# Patient Record
Sex: Male | Born: 1985 | Race: White | Hispanic: No | Marital: Married | Smoking: Never smoker
Health system: Southern US, Community
[De-identification: ages and names within clinical notes are randomized; demographics above are authoritative.]

## PROBLEM LIST (undated history)

## (undated) DIAGNOSIS — E782 Mixed hyperlipidemia: Secondary | ICD-10-CM

## (undated) DIAGNOSIS — K589 Irritable bowel syndrome without diarrhea: Secondary | ICD-10-CM

## (undated) DIAGNOSIS — K760 Fatty (change of) liver, not elsewhere classified: Secondary | ICD-10-CM

## (undated) DIAGNOSIS — E538 Deficiency of other specified B group vitamins: Secondary | ICD-10-CM

## (undated) DIAGNOSIS — G4733 Obstructive sleep apnea (adult) (pediatric): Secondary | ICD-10-CM

## (undated) DIAGNOSIS — R42 Dizziness and giddiness: Secondary | ICD-10-CM

## (undated) DIAGNOSIS — I10 Essential (primary) hypertension: Secondary | ICD-10-CM

## (undated) DIAGNOSIS — R569 Unspecified convulsions: Secondary | ICD-10-CM

## (undated) HISTORY — DX: Deficiency of other specified B group vitamins: E53.8

## (undated) HISTORY — DX: Essential (primary) hypertension: I10

## (undated) HISTORY — DX: Unspecified convulsions: R56.9

## (undated) HISTORY — PX: VASECTOMY: SHX75

## (undated) HISTORY — DX: Irritable bowel syndrome, unspecified: K58.9

## (undated) HISTORY — DX: Mixed hyperlipidemia: E78.2

## (undated) HISTORY — DX: Fatty (change of) liver, not elsewhere classified: K76.0

## (undated) HISTORY — PX: TYMPANOPLASTY: SHX33

## (undated) HISTORY — DX: Dizziness and giddiness: R42

## (undated) HISTORY — DX: Obstructive sleep apnea (adult) (pediatric): G47.33

---
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
## 2007-01-19 ENCOUNTER — Emergency Department (HOSPITAL_COMMUNITY): Admission: EM | Admit: 2007-01-19 | Discharge: 2007-01-19 | Payer: Self-pay | Admitting: Family Medicine

## 2007-03-01 ENCOUNTER — Emergency Department (HOSPITAL_COMMUNITY): Admission: EM | Admit: 2007-03-01 | Discharge: 2007-03-01 | Payer: Self-pay | Admitting: Emergency Medicine

## 2016-12-16 ENCOUNTER — Ambulatory Visit (HOSPITAL_COMMUNITY)
Admission: EM | Admit: 2016-12-16 | Discharge: 2016-12-16 | Disposition: A | Discharge status: Home / Self Care | Payer: BLUE CROSS/BLUE SHIELD | Attending: Family Medicine | Admitting: Family Medicine

## 2016-12-16 ENCOUNTER — Encounter (HOSPITAL_COMMUNITY): Payer: Self-pay | Admitting: Emergency Medicine

## 2016-12-16 DIAGNOSIS — R062 Wheezing: Secondary | ICD-10-CM

## 2016-12-16 DIAGNOSIS — R05 Cough: Secondary | ICD-10-CM | POA: Diagnosis not present

## 2016-12-16 DIAGNOSIS — J40 Bronchitis, not specified as acute or chronic: Secondary | ICD-10-CM | POA: Diagnosis not present

## 2016-12-16 DIAGNOSIS — R059 Cough, unspecified: Secondary | ICD-10-CM

## 2016-12-16 DIAGNOSIS — J683 Other acute and subacute respiratory conditions due to chemicals, gases, fumes and vapors: Secondary | ICD-10-CM

## 2016-12-16 MED ORDER — ALBUTEROL SULFATE HFA 108 (90 BASE) MCG/ACT IN AERS: 1.0000 | INHALATION_SPRAY | Freq: Four times a day (QID) | RESPIRATORY_TRACT | 0 refills | Status: DC | PRN

## 2016-12-16 MED ORDER — AZITHROMYCIN 250 MG PO TABS
250.0000 mg | ORAL_TABLET | Freq: Every day | ORAL | 0 refills | Status: DC
Start: 2016-12-16 — End: 2017-03-26

## 2016-12-16 MED ORDER — ALBUTEROL SULFATE (2.5 MG/3ML) 0.083% IN NEBU
2.5000 mg | INHALATION_SOLUTION | Freq: Once | RESPIRATORY_TRACT | Status: AC
  Administered 2016-12-16: 2.5 mg via RESPIRATORY_TRACT

## 2016-12-16 MED ORDER — ALBUTEROL SULFATE (2.5 MG/3ML) 0.083% IN NEBU
INHALATION_SOLUTION | RESPIRATORY_TRACT | Status: AC
Start: 2016-12-16 — End: 2016-12-16
  Filled 2016-12-16: qty 3

## 2016-12-16 MED ORDER — METHYLPREDNISOLONE 4 MG PO TBPK: ORAL_TABLET | ORAL | 0 refills | Status: DC

## 2016-12-16 NOTE — ED Provider Notes (Signed)
CSN: [id number]     Arrival date & time 12/16/16  1226 History   First MD Initiated Contact with Patient 12/16/16 1243     Chief Complaint  Patient presents with  . Cough   (Consider location/radiation/quality/duration/timing/severity/associated sxs/prior Treatment) Patient presents with c/o cough and burning in chest.  He has had sx's for a week and have worsened in last 3 days.  He c/o wheezing which is worse at night.  He states he just quit vaping.   The history is provided by the patient.  Cough  Cough characteristics:  Productive Sputum characteristics:  White Severity:  Moderate Onset quality:  Sudden Duration:  1 week Timing:  Constant Progression:  Worsening Chronicity:  New Smoker: no   Relieved by:  Nothing Worsened by:  Nothing Ineffective treatments:  None tried Associated symptoms: wheezing     History reviewed. No pertinent past medical history. History reviewed. No pertinent surgical history. History reviewed. No pertinent family history. Social History  Substance Use Topics  . Smoking status: Never Smoker  . Smokeless tobacco: Never Used  . Alcohol use No    Review of Systems  Constitutional: Positive for fatigue.  HENT: Positive for congestion.   Eyes: Negative.   Respiratory: Positive for cough and wheezing.   Cardiovascular: Negative.   Gastrointestinal: Negative.   Endocrine: Negative.   Genitourinary: Negative.   Musculoskeletal: Negative.   Allergic/Immunologic: Negative.   Neurological: Negative.   Hematological: Negative.   Psychiatric/Behavioral: Negative.     Allergies  Patient has no known allergies.  Home Medications   Prior to Admission medications   Medication Sig Start Date End Date Taking? Authorizing Provider  albuterol (PROVENTIL HFA;VENTOLIN HFA) 108 (90 Base) MCG/ACT inhaler Inhale 1-2 puffs into the lungs every 6 (six) hours as needed for wheezing or shortness of breath. 12/16/16   Deatra Canterxford, William J, FNP  azithromycin  (ZITHROMAX) 250 MG tablet Take 1 tablet (250 mg total) by mouth daily. Take first 2 tablets together, then 1 every day until finished. 12/16/16   Deatra Canterxford, William J, FNP  methylPREDNISolone (MEDROL DOSEPAK) 4 MG TBPK tablet Take 6-5-4-3-2-1 po qd 12/16/16   Deatra Canterxford, William J, FNP   Meds Ordered and Administered this Visit   Medications  albuterol (PROVENTIL) (2.5 MG/3ML) 0.083% nebulizer solution 2.5 mg (2.5 mg Nebulization Given 12/16/16 1252)    BP 130/73 (BP Location: Right Arm)   Pulse 65   Temp 98.2 F (36.8 C) (Oral)   Resp 18   SpO2 98%  No data found.   Physical Exam  Constitutional: He is oriented to person, place, and time. He appears well-developed and well-nourished.  HENT:  Head: Normocephalic and atraumatic.  Right Ear: External ear normal.  Left Ear: External ear normal.  Mouth/Throat: Oropharynx is clear and moist.  Eyes: Conjunctivae and EOM are normal. Pupils are equal, round, and reactive to light.  Neck: Normal range of motion. Neck supple.  Cardiovascular: Normal rate, regular rhythm and normal heart sounds.   Pulmonary/Chest: Effort normal. He has wheezes.  Abdominal: Soft. Bowel sounds are normal.  Neurological: He is alert and oriented to person, place, and time.  Nursing note and vitals reviewed.   Urgent Care Course     Procedures (including critical care time)  Labs Review Labs Reviewed - No data to display  Imaging Review No results found.   Visual Acuity Review  Right Eye Distance:   Left Eye Distance:   Bilateral Distance:    Right Eye Near:   Left  Eye Near:    Bilateral Near:         MDM   1. Bronchitis   2. Cough   3. Reactive airways dysfunction syndrome (HCC)    Z-Pak Medrol dose pack as directed 4mg  #21 Albuterol MDI  Albuterol Neb treatment      Deatra Canter, FNP 12/16/16 1302    Deatra Canter, FNP 12/16/16 1402

## 2016-12-16 NOTE — ED Triage Notes (Signed)
[facility] a [facility] of a cough and "buring in his chest" x 3 days. The patient reported that he has been using OTC medications with minimal relief.

## 2017-03-26 ENCOUNTER — Ambulatory Visit (HOSPITAL_COMMUNITY)
Admission: EM | Admit: 2017-03-26 | Discharge: 2017-03-26 | Disposition: A | Discharge status: Home / Self Care | Payer: BLUE CROSS/BLUE SHIELD | Attending: Physician Assistant | Admitting: Physician Assistant

## 2017-03-26 ENCOUNTER — Encounter (HOSPITAL_COMMUNITY): Payer: Self-pay | Admitting: *Deleted

## 2017-03-26 DIAGNOSIS — J00 Acute nasopharyngitis [common cold]: Secondary | ICD-10-CM | POA: Diagnosis not present

## 2017-03-26 DIAGNOSIS — R0981 Nasal congestion: Secondary | ICD-10-CM | POA: Diagnosis present

## 2017-03-26 LAB — POCT RAPID STREP A: Streptococcus, Group A Screen (Direct): NEGATIVE

## 2017-03-26 MED ORDER — CETIRIZINE-PSEUDOEPHEDRINE ER 5-120 MG PO TB12: 1.0000 | ORAL_TABLET | Freq: Every day | ORAL | 0 refills | Status: DC

## 2017-03-26 MED ORDER — PHENOL 1.4 % MT LIQD: 1.0000 | OROMUCOSAL | 0 refills | Status: DC | PRN

## 2017-03-26 MED ORDER — BENZONATATE 100 MG PO CAPS: 100.0000 mg | ORAL_CAPSULE | Freq: Three times a day (TID) | ORAL | 0 refills | Status: DC

## 2017-03-26 MED ORDER — FLUTICASONE PROPIONATE 50 MCG/ACT NA SUSP: 2.0000 | Freq: Every day | NASAL | 0 refills | Status: DC

## 2017-03-26 NOTE — Discharge Instructions (Signed)
Rapid strep negative. Symptoms are most likely due to viral illness. Tessalon for cough. Phenol for sore throat. Flonase and/or Zyrtec-D for nasal congestion. You can use over the counter nasal saline rinse such as neti pot for nasal congestion. Monitor for any worsening of symptoms, swelling of the throat, trouble breathing, trouble swallowing, follow up for reevaluation.   For sore throat try using a honey-based tea. Use 3 teaspoons of honey with juice squeezed from half lemon. Place shaved pieces of ginger into 1/2-1 cup of water and warm over stove top. Then mix the ingredients and repeat every 4 hours as needed.

## 2017-03-26 NOTE — ED Triage Notes (Signed)
Patient reports 4 day history of nasal congestion, hoarse voice, and productive cough. Patient reports no relief with otc. Reports fevers at night with sob.

## 2017-03-26 NOTE — ED Provider Notes (Signed)
[facility]    CSN: [id number] Arrival date & time: 03/26/17  1810     History   Chief Complaint Chief Complaint  Patient presents with  . Nasal Congestion    HPI Jesus Montes is a 31 y.o. male.   31 year old male comes in for 4 day history of nasal congestion, hoarse voice, sore throat and productive cough. States intermittent hoarseness. Sore throat can radiate to the ears. Productive cough, most severe at night. Tmax 99.6. Has taken otc nyquil/dayquil with some relief. But he continues to wake up at night with sore throat. Has had shortness of breath due to pain from the throat and hoarseness. Has had mild diarrhea, denies abdominal pain, nausea, vomiting. Denies chest pain. Has had some wheezing. History of seasonal allergies, asthma. Never smoker. Positive sick contact.       History reviewed. No pertinent past medical history.  There are no active problems to display for this patient.   History reviewed. No pertinent surgical history.     Home Medications    Prior to Admission medications   Medication Sig Start Date End Date Taking? Authorizing Provider  benzonatate (TESSALON) 100 MG capsule Take 1 capsule (100 mg total) by mouth every 8 (eight) hours. 03/26/17   Cathie Hoops, Johnnette Laux V, PA-C  cetirizine-pseudoephedrine (ZYRTEC-D) 5-120 MG tablet Take 1 tablet by mouth daily. 03/26/17   Cathie Hoops, Edison Wollschlager V, PA-C  fluticasone (FLONASE) 50 MCG/ACT nasal spray Place 2 sprays into both nostrils daily. 03/26/17   Cathie Hoops, Kento Gossman V, PA-C  phenol (CHLORASEPTIC) 1.4 % LIQD Use as directed 1 spray in the mouth or throat as needed for throat irritation / pain. 03/26/17   Belinda Fisher, PA-C    Family History History reviewed. No pertinent family history.  Social History Social History  Substance Use Topics  . Smoking status: Never Smoker  . Smokeless tobacco: Never Used  . Alcohol use No     Allergies   Patient has no known allergies.   Review of Systems Review of Systems  Reason  unable to perform ROS: See HPI as above.     Physical Exam Triage Vital Signs ED Triage Vitals  Enc Vitals Group     BP 03/26/17 1845 128/79     Pulse Rate 03/26/17 1845 89     Resp 03/26/17 1845 17     Temp 03/26/17 1845 98.2 F (36.8 C)     Temp Source 03/26/17 1845 Oral     SpO2 03/26/17 1845 100 %     Weight --      Height --      Head Circumference --      Peak Flow --      Pain Score 03/26/17 1846 3     Pain Loc --      Pain Edu? --      Excl. in GC? --    No data found.   Updated Vital Signs BP 128/79 (BP Location: Left Arm)   Pulse 89   Temp 98.2 F (36.8 C) (Oral)   Resp 17   SpO2 100%   Physical Exam  Constitutional: He is oriented to person, place, and time. He appears well-developed and well-nourished. No distress.  HENT:  Head: Normocephalic and atraumatic.  Right Ear: External ear and ear canal normal.  Left Ear: External ear and ear canal normal. A middle ear effusion is present.  Nose: Mucosal edema and rhinorrhea present. Right sinus exhibits maxillary sinus tenderness. Right sinus exhibits  no frontal sinus tenderness. Left sinus exhibits maxillary sinus tenderness. Left sinus exhibits no frontal sinus tenderness.  Mouth/Throat: Uvula is midline and mucous membranes are normal. Posterior oropharyngeal erythema present. Tonsils are 1+ on the right. Tonsils are 1+ on the left. No tonsillar exudate.  Right TM is white and opaque with red injection at the 10 o'clock region. 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   Eyes: Pupils are equal, round, and reactive to light. Conjunctivae are normal.  Neck: Normal range of motion. Neck supple.  Cardiovascular: Normal rate, regular rhythm and normal heart sounds.  Exam reveals no gallop and no friction rub.   No murmur heard. Pulmonary/Chest: Effort normal and breath sounds normal. He has no decreased breath sounds. He has no wheezes. He has no rhonchi. He has no rales.    Lymphadenopathy:    He has no cervical adenopathy.  Neurological: He is alert and oriented to person, place, and time.  Skin: Skin is warm and dry.  Psychiatric: He has a normal mood and affect. His behavior is normal. Judgment normal.     UC Treatments / Results  Labs (all labs ordered are listed, but only abnormal results are displayed) Labs Reviewed  CULTURE, GROUP A STREP [facility])  POCT RAPID STREP A    EKG  EKG Interpretation None       Radiology No results found.  Procedures Procedures (including critical care time)  Medications Ordered in UC Medications - No data to display   Initial Impression / Assessment and Plan / UC Course  I have reviewed the triage vital signs and the nursing notes.  Pertinent labs & imaging results that were available during my care of the patient were reviewed by me and considered in my medical decision making (see chart for details).    Rapid strep negative. Symptomatic treatment as needed. Return precautions given.     Final Clinical Impressions(s) / UC Diagnoses   Final diagnoses:  Acute nasopharyngitis    New Prescriptions Discharge Medication List as of 03/26/2017  7:52 PM    START taking these medications   Details  benzonatate (TESSALON) 100 MG capsule Take 1 capsule (100 mg total) by mouth every 8 (eight) hours., Starting Fri 03/26/2017, Normal    cetirizine-pseudoephedrine (ZYRTEC-D) 5-120 MG tablet Take 1 tablet by mouth daily., Starting Fri 03/26/2017, Normal    fluticasone (FLONASE) 50 MCG/ACT nasal spray Place 2 sprays into both nostrils daily., Starting Fri 03/26/2017, Normal    phenol (CHLORASEPTIC) 1.4 % LIQD Use as directed 1 spray in the mouth or throat as needed for throat irritation / pain., Starting Fri 03/26/2017, Normal          Cathie Hoops, Darlean Warmoth V, PA-C 03/26/17 2125

## 2017-03-29 LAB — CULTURE, GROUP A STREP (THRC)

## 2020-05-21 ENCOUNTER — Other Ambulatory Visit: Payer: Self-pay

## 2020-05-21 ENCOUNTER — Ambulatory Visit (INDEPENDENT_AMBULATORY_CARE_PROVIDER_SITE_OTHER): Payer: BC Managed Care – PPO | Admitting: Family Medicine

## 2020-05-21 ENCOUNTER — Encounter: Payer: Self-pay | Admitting: Family Medicine

## 2020-05-21 VITALS — BP 130/90 | HR 72 | Temp 98.5°F | Ht 67.0 in | Wt 246.5 lb

## 2020-05-21 DIAGNOSIS — Z13220 Encounter for screening for lipoid disorders: Secondary | ICD-10-CM | POA: Diagnosis not present

## 2020-05-21 DIAGNOSIS — E669 Obesity, unspecified: Secondary | ICD-10-CM

## 2020-05-21 DIAGNOSIS — Z0000 Encounter for general adult medical examination without abnormal findings: Secondary | ICD-10-CM | POA: Diagnosis not present

## 2020-05-21 DIAGNOSIS — B359 Dermatophytosis, unspecified: Secondary | ICD-10-CM | POA: Diagnosis not present

## 2020-05-21 DIAGNOSIS — R030 Elevated blood-pressure reading, without diagnosis of hypertension: Secondary | ICD-10-CM | POA: Insufficient documentation

## 2020-05-21 DIAGNOSIS — Z8249 Family history of ischemic heart disease and other diseases of the circulatory system: Secondary | ICD-10-CM | POA: Insufficient documentation

## 2020-05-21 NOTE — Assessment & Plan Note (Signed)
Blood work today. Lifestyle changes.

## 2020-05-21 NOTE — Assessment & Plan Note (Signed)
Recommended OTC lamisil cream

## 2020-05-21 NOTE — Assessment & Plan Note (Signed)
Several family members with MI in the 30s including his father. Will get labs today. Discussed that he could consider cardiology consult or Coronary CT.

## 2020-05-21 NOTE — Patient Instructions (Addendum)
For rash - try over the counter lamisil cream (anti-fungal cream)    You have a Hemorrhoid  Here are ways to prevent future issues and to treat your hemorrhoid 1) Make sure you get 25-35 g of INSOLUBLE fiber a day -- ideally from diet but you can also take a fiber supplement like Metamucil 2) Drink 1.5 to 2 liters of water daily  3) Avoid straining and prolonged periods on the toilet 4) Limit fatty foods and alcohol 5) Try to lose weight  How to care for your hemorrhoid now 1) Sitz Baths and warm water sprays 2) Wipe with a wet wipe or flushable wipe  3) Consider using stool softeners (Like Colace or Miralax) 4) Topical Hemorrhoid cream (Preparation H is available over the counter)  5) Steroid suppository - though can be expensive  Let me know if no improvement in 6 weeks -- you may need surgery  Your blood pressure high.   High blood pressure increases your risk for heart attack and stroke.    Please check your blood pressure 2-4 times a week.   To check your blood pressure 1) Sit in a quiet and relaxed place for 5 minutes 2) Make sure your feet are flat on the ground 3) Consider checking first thing in the morning   Normal blood pressure is less than 140/90 Ideally you blood pressure should be around 120/80  Other ways you can reduce your blood pressure:  1) Regular exercise -- Try to get 150 minutes (30 minutes, 5 days a week) of moderate to vigorous aerobic excercise -- Examples: brisk walking (2.5 miles per hour), water aerobics, dancing, gardening, tennis, biking slower than 10 miles per hour 2) DASH Diet - low fat meats, more fresh fruits and vegetables, whole grains, low salt 3) Quit smoking if you smoke 4) Loose 5-10% of your body weight   DASH Eating Plan DASH stands for "Dietary Approaches to Stop Hypertension." The DASH eating plan is a healthy eating plan that has been shown to reduce high blood pressure (hypertension). It may also reduce your risk for type  2 diabetes, heart disease, and stroke. The DASH eating plan may also help with weight loss. What are tips for following this plan?  General guidelines  Avoid eating more than 2,300 mg (milligrams) of salt (sodium) a day. If you have hypertension, you may need to reduce your sodium intake to 1,500 mg a day.  Limit alcohol intake to no more than 1 drink a day for nonpregnant women and 2 drinks a day for men. One drink equals 12 oz of beer, 5 oz of wine, or 1 oz of hard liquor.  Work with your health care provider to maintain a healthy body weight or to lose weight. Ask what an ideal weight is for you.  Get at least 30 minutes of exercise that causes your heart to beat faster (aerobic exercise) most days of the week. Activities may include walking, swimming, or biking.  Work with your health care provider or diet and nutrition specialist (dietitian) to adjust your eating plan to your individual calorie needs. Reading food labels   Check food labels for the amount of sodium per serving. Choose foods with less than 5 percent of the Daily Value of sodium. Generally, foods with less than 300 mg of sodium per serving fit into this eating plan.  To find whole grains, look for the word "whole" as the first word in the ingredient list. Shopping  Buy products labeled as "  low-sodium" or "no salt added."  Buy fresh foods. Avoid canned foods and premade or frozen meals. Cooking  Avoid adding salt when cooking. Use salt-free seasonings or herbs instead of table salt or sea salt. Check with your health care provider or pharmacist before using salt substitutes.  Do not fry foods. Cook foods using healthy methods such as baking, boiling, grilling, and broiling instead.  Cook with heart-healthy oils, such as olive, canola, soybean, or sunflower oil. Meal planning  Eat a balanced diet that includes: ? 5 or more servings of fruits and vegetables each day. At each meal, try to fill half of your plate  with fruits and vegetables. ? Up to 6-8 servings of whole grains each day. ? Less than 6 oz of lean meat, poultry, or fish each day. A 3-oz serving of meat is about the same size as a deck of cards. One egg equals 1 oz. ? 2 servings of low-fat dairy each day. ? A serving of nuts, seeds, or beans 5 times each week. ? Heart-healthy fats. Healthy fats called Omega-3 fatty acids are found in foods such as flaxseeds and coldwater fish, like sardines, salmon, and mackerel.  Limit how much you eat of the following: ? Canned or prepackaged foods. ? Food that is high in trans fat, such as fried foods. ? Food that is high in saturated fat, such as fatty meat. ? Sweets, desserts, sugary drinks, and other foods with added sugar. ? Full-fat dairy products.  Do not salt foods before eating.  Try to eat at least 2 vegetarian meals each week.  Eat more home-cooked food and less restaurant, buffet, and fast food.  When eating at a restaurant, ask that your food be prepared with less salt or no salt, if possible. What foods are recommended? The items listed may not be a complete list. Talk with your dietitian about what dietary choices are best for you. Grains Whole-grain or whole-wheat bread. Whole-grain or whole-wheat pasta. Brown rice. Orpah Cobb. Bulgur. Whole-grain and low-sodium cereals. Pita bread. Low-fat, low-sodium crackers. Whole-wheat flour tortillas. Vegetables Fresh or frozen vegetables (raw, steamed, roasted, or grilled). Low-sodium or reduced-sodium tomato and vegetable juice. Low-sodium or reduced-sodium tomato sauce and tomato paste. Low-sodium or reduced-sodium canned vegetables. Fruits All fresh, dried, or frozen fruit. Canned fruit in natural juice (without added sugar). Meat and other protein foods Skinless chicken or [place]. Ground chicken or [place]. Pork with fat trimmed off. Fish and seafood. Egg whites. Dried beans, peas, or lentils. Unsalted nuts, nut butters, and seeds.  Unsalted canned beans. Lean cuts of beef with fat trimmed off. Low-sodium, lean deli meat. Dairy Low-fat (1%) or fat-free (skim) milk. Fat-free, low-fat, or reduced-fat cheeses. Nonfat, low-sodium ricotta or cottage cheese. Low-fat or nonfat yogurt. Low-fat, low-sodium cheese. Fats and oils Soft margarine without trans fats. Vegetable oil. Low-fat, reduced-fat, or light mayonnaise and salad dressings (reduced-sodium). Canola, safflower, olive, soybean, and sunflower oils. Avocado. Seasoning and other foods Herbs. Spices. Seasoning mixes without salt. Unsalted popcorn and pretzels. Fat-free sweets. What foods are not recommended? The items listed may not be a complete list. Talk with your dietitian about what dietary choices are best for you. Grains Baked goods made with fat, such as croissants, muffins, or some breads. Dry pasta or rice meal packs. Vegetables Creamed or fried vegetables. Vegetables in a cheese sauce. Regular canned vegetables (not low-sodium or reduced-sodium). Regular canned tomato sauce and paste (not low-sodium or reduced-sodium). Regular tomato and vegetable juice (not low-sodium or reduced-sodium). Rosita Fire.  Olives. Fruits Canned fruit in a light or heavy syrup. Fried fruit. Fruit in cream or butter sauce. Meat and other protein foods Fatty cuts of meat. Ribs. Fried meat. Tomasa Blase. Sausage. Bologna and other processed lunch meats. Salami. Fatback. Hotdogs. Bratwurst. Salted nuts and seeds. Canned beans with added salt. Canned or smoked fish. Whole eggs or egg yolks. Chicken or [place] with skin. Dairy Whole or 2% milk, cream, and half-and-half. Whole or full-fat cream cheese. Whole-fat or sweetened yogurt. Full-fat cheese. Nondairy creamers. Whipped toppings. Processed cheese and cheese spreads. Fats and oils Butter. Stick margarine. Lard. Shortening. Ghee. Bacon fat. Tropical oils, such as coconut, palm kernel, or palm oil. Seasoning and other foods Salted popcorn and  pretzels. Onion salt, garlic salt, seasoned salt, table salt, and sea salt. Worcestershire sauce. Tartar sauce. Barbecue sauce. Teriyaki sauce. Soy sauce, including reduced-sodium. Steak sauce. Canned and packaged gravies. Fish sauce. Oyster sauce. Cocktail sauce. Horseradish that you find on the shelf. Ketchup. Mustard. Meat flavorings and tenderizers. Bouillon cubes. Hot sauce and Tabasco sauce. Premade or packaged marinades. Premade or packaged taco seasonings. Relishes. Regular salad dressings. Where to find more information:  National Heart, Lung, and Blood Institute: PopSteam.is  American Heart Association: www.heart.org Summary  The DASH eating plan is a healthy eating plan that has been shown to reduce high blood pressure (hypertension). It may also reduce your risk for type 2 diabetes, heart disease, and stroke.  With the DASH eating plan, you should limit salt (sodium) intake to 2,300 mg a day. If you have hypertension, you may need to reduce your sodium intake to 1,500 mg a day.  When on the DASH eating plan, aim to eat more fresh fruits and vegetables, whole grains, lean proteins, low-fat dairy, and heart-healthy fats.  Work with your health care provider or diet and nutrition specialist (dietitian) to adjust your eating plan to your individual calorie needs. This information is not intended to replace advice given to you by your health care provider. Make sure you discuss any questions you have with your health care provider. Document Revised: 05/21/2017 Document Reviewed: 06/01/2016 Elsevier Patient Education  2020 [employer].

## 2020-05-21 NOTE — Progress Notes (Signed)
Annual Exam   Chief Complaint:  Chief Complaint  Patient presents with  . Establish Care    no concerns    History of Present Illness:  SAVON BORDONARO is a 34 y.o. presents today for annual examination.     Nutrition/Lifestyle Exercise: during hunting season - every other day is hunting - 4 miles + walking Diet: not currently, not great - tries eat fruits/veggies but is a big meat eater He is single partner, contraception - vasectomy.  Any issues getting or maintaining an erection? no  Social History   Tobacco Use  Smoking Status Never Smoker  Smokeless Tobacco Never Used   Social History   Substance and Sexual Activity  Alcohol Use Yes   Comment: once every few months   Social History   Substance and Sexual Activity  Drug Use No     Safety The patient wears seatbelts: yes.     The patient feels safe at home and in their relationships: yes.  General Health Dentist in the last year: Yes Eye doctor: yes  Weight Wt Readings from Last 3 Encounters:  05/21/20 246 lb 8 oz (111.8 kg)   Patient has high BMI  BMI Readings from Last 1 Encounters:  05/21/20 38.61 kg/m     Chronic disease screening Blood pressure monitoring:  BP Readings from Last 3 Encounters:  05/21/20 130/90  03/26/17 128/79  12/16/16 130/73    Lipid Monitoring: Indication for screening: age >35, obesity, diabetes, family hx, CV risk factors.  Lipid screening: Yes  No results found for: CHOL, HDL, LDLCALC, LDLDIRECT, TRIG, CHOLHDL   Diabetes Screening: age >85, overweight, family hx, PCOS, hx of gestational diabetes, at risk ethnicity, elevated blood pressure >135/80.  Diabetes Screening screening: Yes  No results found for: HGBA1C    There is no immunization history on file for this patient.  Past Medical History:  Diagnosis Date  . Seizures (HCC)    in elementary school due to blood sugar    Past Surgical History:  Procedure Laterality Date  . TYMPANOPLASTY    .  VASECTOMY      Prior to Admission medications   Not on File    No Known Allergies   Social History   Socioeconomic History  . Marital status: Married    Spouse name: Chelsey  . Number of children: 4  . Years of education: high school  . Highest education level: Not on file  Occupational History  . Not on file  Tobacco Use  . Smoking status: Never Smoker  . Smokeless tobacco: Never Used  Vaping Use  . Vaping Use: Every day  Substance and Sexual Activity  . Alcohol use: Yes    Comment: once every few months  . Drug use: No  . Sexual activity: Yes    Birth control/protection: Surgical  Other Topics Concern  . 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 Difficulty of Paying Living Expenses: Not on file  Food Insecurity:   . Worried About Programme researcher, broadcasting/film/video in the Last Year: Not on file  . Ran Out of Food in the Last Year: Not on file  Transportation Needs:   . Lack of Transportation (Medical): Not on file  . Lack of Transportation (Non-Medical): Not on file  Physical Activity:   . Days of Exercise per Week: Not on file  . Minutes of Exercise per Session: Not on file  Stress:   . Feeling of Stress : Not on file  Social Connections:   . Frequency of Communication with Friends and Family: Not on file  . Frequency of Social Gatherings with Friends and Family: Not on file  . Attends Religious Services: Not on file   . Active Member of Clubs or Organizations: Not on file  . Attends Banker Meetings: Not on file  . Marital Status: Not on file  Intimate Partner Violence:   . Fear of Current or Ex-Partner: Not on file  . Emotionally Abused: Not on file  . Physically Abused: Not on file  . Sexually Abused: Not on file    Family History  Problem Relation Age of Onset  . Asthma Mother   . Heart attack Father 86  . Asthma Son   . Heart disease Paternal Uncle   . Heart attack Paternal Uncle 35  . Heart disease Maternal Grandmother        stent placement  . Parkinson's disease Maternal Grandfather   . Heart attack Paternal Grandfather 31  . Heart attack Paternal Uncle 35  . Heart attack Paternal Uncle   . Heart attack Paternal Uncle   . Heart attack Paternal Uncle     Review of Systems  Constitutional: Negative for chills and fever.  HENT: Negative for congestion and sore throat.   Eyes: Negative for blurred vision and double vision.  Respiratory: Negative for shortness of breath.   Cardiovascular: Negative for chest pain.  Gastrointestinal: Negative for heartburn, nausea and vomiting.       Hemorrhoids   Genitourinary: Negative.   Musculoskeletal: Negative.  Negative for myalgias.  Skin: Negative for rash.  Neurological: Negative for dizziness and headaches.  Endo/Heme/Allergies: Does not bruise/bleed easily.  Psychiatric/Behavioral: Negative for depression. The patient is not nervous/anxious.      Physical Exam BP 130/90   Pulse 72   Temp 98.5 F (36.9 C) (Temporal)   Ht 5\' 7"  (1.702 m)   Wt 246 lb 8 oz (111.8 kg)   SpO2 97%   BMI 38.61 kg/m    BP Readings from Last 3 Encounters:  05/21/20 130/90  03/26/17 128/79  12/16/16 130/73      Physical Exam Constitutional:      General: He is not in acute distress.    Appearance: He is well-developed. He is not diaphoretic.  HENT:     Head: Normocephalic and atraumatic.     Right Ear: Tympanic membrane and ear  canal normal.     Left Ear: Tympanic membrane and ear canal normal.     Nose: Nose normal.     Mouth/Throat:     Pharynx: Uvula midline.  Eyes:     General: No scleral icterus.    Conjunctiva/sclera: Conjunctivae normal.     Pupils: Pupils are equal, round, and reactive  to light.  Cardiovascular:     Rate and Rhythm: Normal rate and regular rhythm.     Heart sounds: Normal heart sounds. No murmur heard.   Pulmonary:     Effort: Pulmonary effort is normal. No respiratory distress.     Breath sounds: Normal breath sounds. No wheezing.  Abdominal:     General: Bowel sounds are normal. There is no distension.     Palpations: Abdomen is soft. There is no mass.     Tenderness: There is no abdominal tenderness. There is no guarding.  Musculoskeletal:        General: Normal range of motion.     Cervical back: Normal range of motion and neck supple.  Lymphadenopathy:     Cervical: No cervical adenopathy.  Skin:    General: Skin is warm and dry.     Capillary Refill: Capillary refill takes less than 2 seconds.     Comments: Left anterior neck below the chin with circular erythematous macular patch with slight scale  Neurological:     Mental Status: He is alert and oriented to person, place, and time.        Results:  PHQ-9:   Depression screen PHQ 2/9 05/21/2020  Decreased Interest 0  Down, Depressed, Hopeless 0  PHQ - 2 Score 0      Assessment: 34 y.o. here for routine annual physical examination.  Plan: Problem List Items Addressed This Visit      Musculoskeletal and Integument   Tinea    Recommended OTC lamisil cream        Other   Elevated blood pressure reading    Blood work today. Lifestyle changes.       Relevant Orders   Comprehensive metabolic panel   TSH   CBC with Differential   Lipid panel   Family history of MI (myocardial infarction)    Several family members with MI in the 30s including his father. Will get labs today. Discussed that he could  consider cardiology consult or Coronary CT.        Other Visit Diagnoses    Annual physical exam    -  Primary   Screening for hyperlipidemia       Relevant Orders   Lipid panel      Screening: -- Blood pressure screen elevated: continued to monitor. -- cholesterol screening: will obtain -- Weight screening: obese: discussed management options, including lifestyle, dietary, and exercise. -- Diabetes Screening: will obtain -- Nutrition: Encouraged healthy diet and exercise  The ASCVD Risk score Denman George DC Jr., et al., 2013) failed to calculate for the following reasons:   The 2013 ASCVD risk score is only valid for ages 68 to 45  -- Statin therapy for Age 45-75 with CVD risk >7.5%  Psych -- Depression screening (PHQ-9):    Safety -- tobacco screening: using nicotine - advised quitting and he is already cutting back -- alcohol screening:  low-risk usage. -- no evidence of domestic violence or intimate partner violence.   Cancer Screening -- No age related cancer screening due  Immunizations  There is no immunization history on file for this patient.  -- flu vaccine declined -- TDAP q10 years declined -- Covid-19 Vaccine declined  Overall endorsed general mis-trust of doctors and healthcare. It took a lot to have him come in today. Not ready to get a vaccine   Encouraged regular vision and dental screening. Encouraged healthy exercise and diet.   Lynnda Child

## 2020-08-16 ENCOUNTER — Other Ambulatory Visit (INDEPENDENT_AMBULATORY_CARE_PROVIDER_SITE_OTHER): Payer: BC Managed Care – PPO

## 2020-08-16 ENCOUNTER — Other Ambulatory Visit: Payer: Self-pay

## 2020-08-16 DIAGNOSIS — R030 Elevated blood-pressure reading, without diagnosis of hypertension: Secondary | ICD-10-CM | POA: Diagnosis not present

## 2020-08-16 DIAGNOSIS — Z0000 Encounter for general adult medical examination without abnormal findings: Secondary | ICD-10-CM | POA: Diagnosis not present

## 2020-08-16 DIAGNOSIS — Z13220 Encounter for screening for lipoid disorders: Secondary | ICD-10-CM

## 2020-08-16 DIAGNOSIS — E669 Obesity, unspecified: Secondary | ICD-10-CM | POA: Diagnosis not present

## 2020-08-16 LAB — LIPID PANEL
Cholesterol: 156 mg/dL (ref 0–200)
HDL: 37.5 mg/dL — ABNORMAL LOW (ref >39.00)
LDL Cholesterol: 89 mg/dL (ref 0–99)
NonHDL: 118.13
Total CHOL/HDL Ratio: 4
Triglycerides: 144 mg/dL (ref 0.0–149.0)
VLDL: 28.8 mg/dL (ref 0.0–40.0)

## 2020-08-16 LAB — CBC WITH DIFFERENTIAL/PLATELET
Basophils Absolute: 0 10*3/uL (ref 0.0–0.1)
Basophils Relative: 0.6 % (ref 0.0–3.0)
Eosinophils Absolute: 0.2 10*3/uL (ref 0.0–0.7)
Eosinophils Relative: 2.3 % (ref 0.0–5.0)
HCT: 48.8 % (ref 39.0–52.0)
Hemoglobin: 17 g/dL (ref 13.0–17.0)
Lymphocytes Relative: 32.6 % (ref 12.0–46.0)
Lymphs Abs: 2.6 10*3/uL (ref 0.7–4.0)
MCHC: 34.8 g/dL (ref 30.0–36.0)
MCV: 91.7 fl (ref 78.0–100.0)
Monocytes Absolute: 0.7 10*3/uL (ref 0.1–1.0)
Monocytes Relative: 8.9 % (ref 3.0–12.0)
Neutro Abs: 4.4 10*3/uL (ref 1.4–7.7)
Neutrophils Relative %: 55.6 % (ref 43.0–77.0)
Platelets: 227 10*3/uL (ref 150.0–400.0)
RBC: 5.32 Mil/uL (ref 4.22–5.81)
RDW: 13 % (ref 11.5–15.5)
WBC: 8 10*3/uL (ref 4.0–10.5)

## 2020-08-16 LAB — COMPREHENSIVE METABOLIC PANEL
ALT: 88 U/L — ABNORMAL HIGH (ref 0–53)
AST: 42 U/L — ABNORMAL HIGH (ref 0–37)
Albumin: 4.6 g/dL (ref 3.5–5.2)
Alkaline Phosphatase: 55 U/L (ref 39–117)
BUN: 9 mg/dL (ref 6–23)
CO2: 26 mEq/L (ref 19–32)
Calcium: 9.6 mg/dL (ref 8.4–10.5)
Chloride: 103 mEq/L (ref 96–112)
Creatinine, Ser: 1.08 mg/dL (ref 0.40–1.50)
GFR: 90.85 mL/min (ref >60.00)
Glucose, Bld: 93 mg/dL (ref 70–99)
Potassium: 4.3 mEq/L (ref 3.5–5.1)
Sodium: 137 mEq/L (ref 135–145)
Total Bilirubin: 0.8 mg/dL (ref 0.2–1.2)
Total Protein: 7.1 g/dL (ref 6.0–8.3)

## 2020-08-16 LAB — TSH: TSH: 3.01 u[IU]/mL (ref 0.35–4.50)

## 2020-08-16 LAB — HEMOGLOBIN A1C: Hgb A1c MFr Bld: 5.4 % (ref 4.6–6.5)

## 2020-08-30 ENCOUNTER — Encounter (HOSPITAL_COMMUNITY): Payer: Self-pay | Admitting: Emergency Medicine

## 2020-08-30 ENCOUNTER — Telehealth: Payer: Self-pay

## 2020-08-30 ENCOUNTER — Emergency Department (HOSPITAL_COMMUNITY): Payer: BC Managed Care – PPO

## 2020-08-30 ENCOUNTER — Emergency Department (HOSPITAL_COMMUNITY)
Admission: EM | Admit: 2020-08-30 | Discharge: 2020-08-30 | Disposition: A | Discharge status: Home / Self Care | Payer: BC Managed Care – PPO | Attending: Emergency Medicine | Admitting: Emergency Medicine

## 2020-08-30 DIAGNOSIS — R1114 Bilious vomiting: Secondary | ICD-10-CM

## 2020-08-30 DIAGNOSIS — R42 Dizziness and giddiness: Secondary | ICD-10-CM | POA: Diagnosis not present

## 2020-08-30 DIAGNOSIS — R112 Nausea with vomiting, unspecified: Secondary | ICD-10-CM | POA: Diagnosis not present

## 2020-08-30 DIAGNOSIS — H538 Other visual disturbances: Secondary | ICD-10-CM | POA: Insufficient documentation

## 2020-08-30 DIAGNOSIS — R29818 Other symptoms and signs involving the nervous system: Secondary | ICD-10-CM | POA: Diagnosis not present

## 2020-08-30 LAB — CBC WITH DIFFERENTIAL/PLATELET
Abs Immature Granulocytes: 0.15 10*3/uL — ABNORMAL HIGH (ref 0.00–0.07)
Basophils Absolute: 0.1 10*3/uL (ref 0.0–0.1)
Basophils Relative: 0 %
Eosinophils Absolute: 0.1 10*3/uL (ref 0.0–0.5)
Eosinophils Relative: 0 %
HCT: 47.1 % (ref 39.0–52.0)
Hemoglobin: 16.7 g/dL (ref 13.0–17.0)
Immature Granulocytes: 1 %
Lymphocytes Relative: 13 %
Lymphs Abs: 1.7 10*3/uL (ref 0.7–4.0)
MCH: 32.1 pg (ref 26.0–34.0)
MCHC: 35.5 g/dL (ref 30.0–36.0)
MCV: 90.6 fL (ref 80.0–100.0)
Monocytes Absolute: 0.9 10*3/uL (ref 0.1–1.0)
Monocytes Relative: 6 %
Neutro Abs: 10.7 10*3/uL — ABNORMAL HIGH (ref 1.7–7.7)
Neutrophils Relative %: 80 %
Platelets: 231 10*3/uL (ref 150–400)
RBC: 5.2 MIL/uL (ref 4.22–5.81)
RDW: 12.2 % (ref 11.5–15.5)
WBC: 13.5 10*3/uL — ABNORMAL HIGH (ref 4.0–10.5)
nRBC: 0 % (ref 0.0–0.2)

## 2020-08-30 LAB — COMPREHENSIVE METABOLIC PANEL
ALT: 79 U/L — ABNORMAL HIGH (ref 0–44)
AST: 34 U/L (ref 15–41)
Albumin: 4.5 g/dL (ref 3.5–5.0)
Alkaline Phosphatase: 56 U/L (ref 38–126)
Anion gap: 9 (ref 5–15)
BUN: 11 mg/dL (ref 6–20)
CO2: 25 mmol/L (ref 22–32)
Calcium: 9.4 mg/dL (ref 8.9–10.3)
Chloride: 104 mmol/L (ref 98–111)
Creatinine, Ser: 0.88 mg/dL (ref 0.61–1.24)
GFR, Estimated: >60 mL/min (ref >60)
Glucose, Bld: 119 mg/dL — ABNORMAL HIGH (ref 70–99)
Potassium: 4.1 mmol/L (ref 3.5–5.1)
Sodium: 138 mmol/L (ref 135–145)
Total Bilirubin: 0.5 mg/dL (ref 0.3–1.2)
Total Protein: 7.1 g/dL (ref 6.5–8.1)

## 2020-08-30 LAB — MAGNESIUM: Magnesium: 1.8 mg/dL (ref 1.7–2.4)

## 2020-08-30 MED ORDER — MECLIZINE HCL 25 MG PO TABS: 25.0000 mg | ORAL_TABLET | Freq: Three times a day (TID) | ORAL | 0 refills | Status: AC

## 2020-08-30 MED ORDER — MECLIZINE HCL 25 MG PO TABS
25.0000 mg | ORAL_TABLET | Freq: Once | ORAL | Status: AC
  Administered 2020-08-30: 25 mg via ORAL
  Filled 2020-08-30: qty 1

## 2020-08-30 MED ORDER — SODIUM CHLORIDE 0.9 % IV BOLUS
1000.0000 mL | Freq: Once | INTRAVENOUS | Status: AC
  Administered 2020-08-30: 1000 mL via INTRAVENOUS

## 2020-08-30 MED ORDER — LORAZEPAM 2 MG/ML IJ SOLN
1.0000 mg | Freq: Once | INTRAMUSCULAR | Status: AC
  Administered 2020-08-30: 1 mg via INTRAVENOUS
  Filled 2020-08-30: qty 1

## 2020-08-30 MED ORDER — LORAZEPAM 1 MG PO TABS: 1.0000 mg | ORAL_TABLET | Freq: Three times a day (TID) | ORAL | 0 refills | Status: DC | PRN

## 2020-08-30 NOTE — ED Triage Notes (Addendum)
Patient here for evaluation of intermittent dizziness, blurred vision, and emesis. Patient states it feels like the room is spinning. States first episode occurred approximately one and a half weeks ago, then second episode today at approximately 1230. Patient alert, oriented, and in no apparent distress at this time. Grip strength equal bilaterally, no visual field deficit, pupils equal, round, reactive to light, no arm drift, no change in sensation. Speech is clear, no facial droop.

## 2020-08-30 NOTE — Telephone Encounter (Signed)
Jesus Montes pts wife calls and no DPR signed and Jesus Montes spoke with pt who gave permission to speak with wife. I spoke with [facility] and few wks ago pt had same symptoms.as now; at 1 PM today pt felt"funny"; Jesus Montes and when I spoke with pt could not describe what feeling funny means. At 2:30 PM today pt got light headed and then dizzy where the road appeared to be tilting and turning; pt had blurred vision., N&V, when pt had these symptoms he was driving in [place] and pt pulled off rd and called his wife. Pt has had some weakness like he could not control what was going on. No weakness in arms or legs. No CP, SOB,H/A and no difficulty walking.  I spoke with pt and he declined 911 and his wife is on her way to pick pt up to take to ED for eval. I wanted someone to talk with pt until pts wife gets to where he is. Pt said his wife was calling and he put me on  Hold but I got cut off. I was not able to get pt to answer phone and I called Jesus Montes and she said that pt was feeling little better and started driving toward [facility]. Jesus Montes was 10 ' from pt but pt would not stay on side of rd. Jesus Montes said she would stay on phone until she got to pt or they both got to ED. Jesus Montes said that pt had not cked BP since saw Dr Selena Batten in Nov. I told her if pt condition changed or worsened should call 911 and she voiced understanding and she will call pt back. I spoke with pt and he said he felt little better and was going to [facility] ED advised that pt should not be driving and pt said he would pull over if needed and he would talk with wife until he gets to ED. Pt s wife will cb next wk with update. Pt is presently not on BP med. Sending note to Dr Selena Batten who is out of office, Dr Ermalene Searing who is in office and [place] CMA.

## 2020-08-30 NOTE — Telephone Encounter (Signed)
Agree with ER evaluation and nurse recommendations.

## 2020-08-30 NOTE — Telephone Encounter (Signed)
Pt currently in ER

## 2020-08-30 NOTE — Discharge Instructions (Signed)
As discussed, today's evaluation has been generally reassuring. Your CAT scan of your head, and your blood tests were unremarkable.  Your symptoms are likely due to benign positional vertigo.  For the next week you will be taking medication. Take meclizine 3 times daily. If you have episodes of worsening symptoms, you can use the Ativan as needed. Please schedule outpatient follow-up with your physician for appropriate ongoing long-term care.  Return here for concerning changes in your condition.

## 2020-08-30 NOTE — ED Provider Notes (Signed)
[facility] EMERGENCY DEPARTMENT Provider Note   CSN: [id number] Arrival date & time: 08/30/20  1554     History Chief Complaint  Patient presents with  . Blurred Vision    Jesus Montes is a 35 y.o. male.  HPI Patient presents due to ongoing dizziness, gait instability, blurred vision and vomiting. He notes 1 episode about 10 days ago that was less pronounced, of shorter duration.  He was well prior to today, however. About 4 hours prior to ED arrival patient noticed room spinning sensation with subsequent development of nausea. He has had several episodes of vomiting.  He denies weakness, notes that his visual disturbance consists of computer screen seemingly moving in front of him, and inconsistent visual images with ambulation. No actual falls, no pain. Patient vapes, does not smoke. He had a clinical illness consistent with COVID 15 months ago, has not been vaccinated.    Past Medical History:  Diagnosis Date  . Seizures (HCC)    in elementary school due to blood sugar    Patient Active Problem List   Diagnosis Date Noted  . Tinea 05/21/2020  . Elevated blood pressure reading 05/21/2020  . Family history of MI (myocardial infarction) 05/21/2020    Past Surgical History:  Procedure Laterality Date  . TYMPANOPLASTY    . VASECTOMY         Family History  Problem Relation Age of Onset  . Asthma Mother   . Heart attack Father 45  . Asthma Son   . Heart disease Paternal Uncle   . Heart attack Paternal Uncle 35  . Heart disease Maternal Grandmother        stent placement  . Parkinson's disease Maternal Grandfather   . Heart attack Paternal Grandfather 82  . Heart attack Paternal Uncle 35  . Heart attack Paternal Uncle   . Heart attack Paternal Uncle   . Heart attack Paternal Uncle     Social History   Tobacco Use  . Smoking status: Never Smoker  . Smokeless tobacco: Never Used  Vaping Use  . Vaping Use: Every day  Substance Use  Topics  . Alcohol use: Yes    Comment: once every few months  . Drug use: No    Home Medications Prior to Admission medications   Medication Sig Start Date End Date Taking? Authorizing Provider  LORazepam (ATIVAN) 1 MG tablet Take 1 tablet (1 mg total) by mouth 3 (three) times daily as needed (nausea and/or dizziness). 08/30/20  Yes Gerhard Munch, MD  meclizine (ANTIVERT) 25 MG tablet Take 1 tablet (25 mg total) by mouth 3 (three) times daily for 7 days. 08/30/20 09/06/20 Yes Gerhard Munch, MD    Allergies    Patient has no known allergies.  Review of Systems   Review of Systems  Constitutional:       Per HPI, otherwise negative  HENT:       Per HPI, otherwise negative  Eyes: Positive for visual disturbance.  Respiratory:       Per HPI, otherwise negative  Cardiovascular:       Per HPI, otherwise negative  Gastrointestinal: Positive for nausea and vomiting. Negative for abdominal pain.  Endocrine:       Negative aside from HPI  Genitourinary:       Neg aside from HPI   Musculoskeletal:       Per HPI, otherwise negative  Skin: Negative.   Neurological: Positive for dizziness. Negative for syncope, speech difficulty and headaches.  Physical Exam Updated Vital Signs BP (!) 131/104 (BP Location: Right Arm)   Pulse 71   Temp 98.1 F (36.7 C)   Resp 16   SpO2 100%   Physical Exam Vitals and nursing note reviewed.  Constitutional:      General: He is not in acute distress.    Appearance: He is well-developed.  HENT:     Head: Normocephalic and atraumatic.  Eyes:     Conjunctiva/sclera: Conjunctivae normal.  Cardiovascular:     Rate and Rhythm: Normal rate and regular rhythm.  Pulmonary:     Effort: Pulmonary effort is normal. No respiratory distress.     Breath sounds: No stridor.  Abdominal:     General: There is no distension.  Skin:    General: Skin is warm and dry.  Neurological:     Mental Status: He is alert and oriented to person, place, and  time.     Cranial Nerves: No dysarthria.     Motor: No tremor or abnormal muscle tone.     Coordination: Coordination normal.     ED Results / Procedures / Treatments   Labs (all labs ordered are listed, but only abnormal results are displayed) Labs Reviewed  COMPREHENSIVE METABOLIC PANEL - Abnormal; Notable for the following components:      Result Value   Glucose, Bld 119 (*)    ALT 79 (*)    All other components within normal limits  CBC WITH DIFFERENTIAL/PLATELET - Abnormal; Notable for the following components:   WBC 13.5 (*)    Neutro Abs 10.7 (*)    Abs Immature Granulocytes 0.15 (*)    All other components within normal limits  MAGNESIUM    EKG EKG Interpretation  Date/Time:  Friday August 30 2020 16:00:26 EST Ventricular Rate:  77 PR Interval:  154 QRS Duration: 90 QT Interval:  374 QTC Calculation: 423 R Axis:   62 Text Interpretation: Normal sinus rhythm Artifact Otherwise within normal limits Confirmed by Gerhard Munch [phone]) on 08/30/2020 4:18:25 PM   Radiology CT Head Wo Contrast  Result Date: 08/30/2020 CLINICAL DATA:  Neuro deficit, acute, stroke suspected Dizziness, non-specific Dizziness and vomiting. EXAM: CT HEAD WITHOUT CONTRAST TECHNIQUE: Contiguous axial images were obtained from the base of the skull through the vertex without intravenous contrast. COMPARISON:  None. FINDINGS: Brain: No intracranial hemorrhage, mass effect, or midline shift. No hydrocephalus. The basilar cisterns are patent. No evidence of territorial infarct or acute ischemia. No extra-axial or intracranial fluid collection. Vascular: No hyperdense vessel or unexpected calcification. Skull: Normal. Negative for fracture or focal lesion. Sinuses/Orbits: Paranasal sinuses and mastoid air cells are clear. The visualized orbits are unremarkable. Other: None. IMPRESSION: Negative noncontrast head CT. Electronically Signed   By: Narda Rutherford M.D.   On: 08/30/2020 17:25     Procedures Procedures 6:30 PM   Medications Ordered in ED Medications  sodium chloride 0.9 % bolus 1,000 mL (1,000 mLs Intravenous New Bag/Given 08/30/20 1647)  LORazepam (ATIVAN) injection 1 mg (1 mg Intravenous Given 08/30/20 1647)  meclizine (ANTIVERT) tablet 25 mg (25 mg Oral Given 08/30/20 1648)    ED Course  I have reviewed the triage vital signs and the nursing notes.  Pertinent labs & imaging results that were available during my care of the patient were reviewed by me and considered in my medical decision making (see chart for details).  6:30 PM Patient with substantial improvement.  He has been ambulatory, without falling, without nausea. Is now committed by his  wife.  We discussed his CT scan which have reviewed, labs, also reviewed. No evidence for intracranial pathology, suspicion for vertigo contributing to his presentation. With otherwise reassuring neurologic exam, vital signs, and a substantial improvement here, low suspicion for occult stroke as well. Patient amenable to, appropriate for discharge with close outpatient follow-up and ongoing meds. Final Clinical Impression(s) / ED Diagnoses Final diagnoses:  Dizziness  Bilious vomiting with nausea    Rx / DC Orders ED Discharge Orders         Ordered    meclizine (ANTIVERT) 25 MG tablet  3 times daily        08/30/20 1828    LORazepam (ATIVAN) 1 MG tablet  3 times daily PRN        08/30/20 1828           Gerhard Munch, MD 08/30/20 1831

## 2020-09-04 ENCOUNTER — Telehealth: Payer: Self-pay | Admitting: *Deleted

## 2020-09-04 NOTE — Telephone Encounter (Signed)
Mychart message to patient. If not viewed, please call with information

## 2020-09-04 NOTE — Telephone Encounter (Signed)
Patient left a voicemail stating that he had lab work done at the hospital Friday. Patient stated that he would like them reviewed and would like the results called back to him.

## 2020-09-11 NOTE — Telephone Encounter (Signed)
Pt read mychart message at 2:36 PM on 09/07/2020

## 2020-10-22 ENCOUNTER — Telehealth: Payer: Self-pay

## 2020-10-22 ENCOUNTER — Telehealth: Payer: Self-pay | Admitting: Family Medicine

## 2020-10-22 NOTE — Telephone Encounter (Signed)
Pt called in wanted to know if there is a way he can get a refill on the medication in the hospital until he sees Dr. Selena Batten tomorrow @8 :20.  The name of the medication is Ativan.  The pharmacy : [facility]- [address]

## 2020-10-22 NOTE — Telephone Encounter (Signed)
What has he been using it for?   It looks like nausea but there are several other medications I would suggest first for nausea and can send one of those in.

## 2020-10-22 NOTE — Telephone Encounter (Signed)
[facility] Night - Client Nonclinical Telephone Record AccessNurse Client [facility] Night - Client Client Site [facility] - Night Physician Gweneth Dimitri- MD Contact Type Call Who Is Calling Patient / Member / Family / Caregiver Caller Name Declined to provide Caller Phone Number Declined to provide Call Type Message Only Information Provided Reason for Call Medication Question / Request Initial Comment Caller states husband needs Rx. refill of vertigo medication, has an upcoming appt. Additional Comment Jesus Montes DOB 12/26/1985 Disp. Time Disposition Final User 10/22/2020 7:39:42 AM General Information Provided Yes Green, Amy Call Closed By: Rudi Rummage Transaction Date/Time: 10/22/2020 7:36:53 AM (ET)

## 2020-10-23 ENCOUNTER — Other Ambulatory Visit: Payer: Self-pay

## 2020-10-23 ENCOUNTER — Ambulatory Visit (INDEPENDENT_AMBULATORY_CARE_PROVIDER_SITE_OTHER): Payer: BC Managed Care – PPO | Admitting: Family Medicine

## 2020-10-23 VITALS — BP 120/80 | HR 71 | Temp 98.8°F | Ht 68.0 in | Wt 239.5 lb

## 2020-10-23 DIAGNOSIS — R42 Dizziness and giddiness: Secondary | ICD-10-CM | POA: Diagnosis not present

## 2020-10-23 DIAGNOSIS — R030 Elevated blood-pressure reading, without diagnosis of hypertension: Secondary | ICD-10-CM

## 2020-10-23 MED ORDER — ONDANSETRON HCL 4 MG PO TABS: 4.0000 mg | ORAL_TABLET | Freq: Three times a day (TID) | ORAL | 0 refills | Status: DC | PRN

## 2020-10-23 MED ORDER — MECLIZINE HCL 25 MG PO TABS
25.0000 mg | ORAL_TABLET | Freq: Three times a day (TID) | ORAL | 0 refills | Status: DC | PRN
Start: 2020-10-23 — End: 2023-03-04

## 2020-10-23 NOTE — Assessment & Plan Note (Signed)
Responding to home exercises. Refill of meclizine prn. Discussed zofran instead of ativan for nausea. Offered ENT and PT but pt will work on home exercises and call if persisting/worsening for referral.

## 2020-10-23 NOTE — Telephone Encounter (Signed)
Pt seen in office today.

## 2020-10-23 NOTE — Patient Instructions (Addendum)
Brandt-Daroff Exercise Here's what you need to do for this exercise:  Start in an upright, seated position on your bed. Tilt your head around a 45-degree angle away from the side causing your vertigo. Move into the lying position on one side with your nose pointed up. Stay in this position for about 30 seconds or until the vertigo eases off, whichever is longer. Then move back to the seated position. Repeat on the other side.  You should do these movements from three to five times in a session. You should have three sessions a day for up to 2 weeks, or until the vertigo is gone for 2 days.  Look up youtube videos  If vertigo not improving or returning frequently - would recommend ENT or physical therapy   Blood pressure - continue to monitor - return in 1-2 months if continuing to be >150/90

## 2020-10-23 NOTE — Progress Notes (Signed)
Subjective:     Jesus Montes is a 35 y.o. male presenting for Dizziness (On and off. Episode yesterday morning, with nausea, that hasn't stopped. ) and Emesis (3 x yesterday )     HPI   #Dizziness - episode on 08/30/2020 - came on when he was out of town  - pulled over the car and wife brought him to the ER - gave him ativan and meclizine  - has been active and exercise w/o issues - mowing yards on the side - was doing well until yesterday morning and woke up and dizziness - at first thought he just got up to quick and felt nauseous - vomiting x 1 w/o improvement - tried to eat  - whenever he opened his eyes everything was moving - then he just stayed in bed until 4 pm - ate some soup yesterday - this morning initially was feeling fine - then went to get into his car and the motion sensation  - took meclizine for 7 days and completed the course of ativan as well - does get some symptoms with turning the head - tried a home exercise today - some improvement with those exercises    Review of Systems   Social History   Tobacco Use  Smoking Status Never Smoker  Smokeless Tobacco Never Used        Objective:    BP Readings from Last 3 Encounters:  10/23/20 120/80  08/30/20 (!) 153/96  05/21/20 130/90   Wt Readings from Last 3 Encounters:  10/23/20 239 lb 8 oz (108.6 kg)  05/21/20 246 lb 8 oz (111.8 kg)    BP 120/80   Pulse 71   Temp 98.8 F (37.1 C) (Temporal)   Ht 5\' 8"  (1.727 m)   Wt 239 lb 8 oz (108.6 kg)   SpO2 98%   BMI 36.42 kg/m    Physical Exam Constitutional:      Appearance: Normal appearance. He is not ill-appearing or diaphoretic.  HENT:     Right Ear: Tympanic membrane and external ear normal.     Left Ear: Tympanic membrane and external ear normal.     Nose: Nose normal.     Mouth/Throat:     Mouth: Mucous membranes are moist.  Eyes:     General: No scleral icterus.    Extraocular Movements: Extraocular movements intact.      Right eye: No nystagmus.     Left eye: No nystagmus.     Conjunctiva/sclera: Conjunctivae normal.  Cardiovascular:     Rate and Rhythm: Normal rate and regular rhythm.     Heart sounds: No murmur heard.   Pulmonary:     Effort: Pulmonary effort is normal. No respiratory distress.     Breath sounds: Normal breath sounds. No wheezing.  Musculoskeletal:     Cervical back: Neck supple.  Skin:    General: Skin is warm and dry.  Neurological:     Mental Status: He is alert. Mental status is at baseline.  Psychiatric:        Mood and Affect: Mood normal.           Assessment & Plan:   Problem List Items Addressed This Visit      Other   Elevated blood pressure reading    Elevated at home in setting of vertigo episodes but normal today. Cont healthy eating/exercise. Return in 3 months for bp or sooner if persistently elevated.       Vertigo -  Primary    Responding to home exercises. Refill of meclizine prn. Discussed zofran instead of ativan for nausea. Offered ENT and PT but pt will work on home exercises and call if persisting/worsening for referral.       Relevant Medications   meclizine (ANTIVERT) 25 MG tablet   ondansetron (ZOFRAN) 4 MG tablet       Return in about 3 months (around 01/23/2021).  Lynnda Child, MD  This visit occurred during the SARS-CoV-2 public health emergency.  Safety protocols were in place, including screening questions prior to the visit, additional usage of staff PPE, and extensive cleaning of exam room while observing appropriate contact time as indicated for disinfecting solutions.

## 2020-10-23 NOTE — Assessment & Plan Note (Signed)
Elevated at home in setting of vertigo episodes but normal today. Cont healthy eating/exercise. Return in 3 months for bp or sooner if persistently elevated.

## 2021-01-28 ENCOUNTER — Ambulatory Visit (INDEPENDENT_AMBULATORY_CARE_PROVIDER_SITE_OTHER): Payer: BC Managed Care – PPO | Admitting: Family Medicine

## 2021-01-28 ENCOUNTER — Other Ambulatory Visit: Payer: Self-pay

## 2021-01-28 VITALS — BP 138/90 | HR 78 | Temp 99.2°F | Ht 68.0 in | Wt 240.0 lb

## 2021-01-28 DIAGNOSIS — F419 Anxiety disorder, unspecified: Secondary | ICD-10-CM

## 2021-01-28 DIAGNOSIS — E669 Obesity, unspecified: Secondary | ICD-10-CM

## 2021-01-28 DIAGNOSIS — I10 Essential (primary) hypertension: Secondary | ICD-10-CM | POA: Insufficient documentation

## 2021-01-28 NOTE — Progress Notes (Signed)
Subjective:     Jesus Montes is a 35 y.o. 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 w/ improvement  Libido is lower Lower energy in general   Will walk the dog  Does see white specks when he stands up at the end up the end of the day   Review of Systems   Social History   Tobacco Use  Smoking Status Never  Smokeless Tobacco Never        Objective:    BP Readings from Last 3 Encounters:  01/28/21 138/90  10/23/20 120/80  08/30/20 (!) 153/96   Wt Readings from Last 3 Encounters:  01/28/21 240 lb (108.9 kg)  10/23/20 239 lb 8 oz (108.6 kg)  05/21/20 246 lb 8 oz (111.8 kg)    BP 138/90   Pulse 78   Temp 99.2 F (37.3 C) (Temporal)   Ht 5\' 8"  (1.727 m)   Wt 240 lb (108.9 kg)   SpO2 97%   BMI 36.49 kg/m    Physical Exam Constitutional:      Appearance: Normal appearance. He is not ill-appearing or diaphoretic.  HENT:     Right Ear: External ear normal.     Left Ear: External ear normal.     Nose: Nose normal.  Eyes:     General: No scleral icterus.    Extraocular Movements: Extraocular movements intact.     Conjunctiva/sclera: Conjunctivae normal.  Cardiovascular:     Rate and Rhythm: Normal rate.  Pulmonary:     Effort: Pulmonary effort is normal.  Musculoskeletal:     Cervical back: Neck supple.  Skin:    General: Skin is warm and dry.  Neurological:     Mental Status: He is alert. Mental status is at baseline.  Psychiatric:        Mood and Affect: Mood normal.          Assessment & Plan:   Problem List Items Addressed This Visit       Cardiovascular and  Mediastinum   Essential hypertension - Primary    Pt notes feeling "off" unclear what the cause of this is. Home values on wrist cuff 170-190/100-115. He will monitor at home and bring cuff to nurse visit. If still elevated at home will start bp medication. If borderline will plan exercise, healthy diet, weight loss.          Other   Anxiety    Encouraged lifestyle changes to help with anxiety including drinking more water (may be caffeine related). Handout provided including number for therapy.        Obesity (BMI 30-39.9)    Pt notes feeling "off". He will increase water intake, continue healthy diet, add exercise. Work to lose 10-15 lbs. Anxiety/BP as noted         Return in about 2 months (around 03/30/2021).  05/30/2021, MD  This visit occurred during the SARS-CoV-2 public health emergency.  Safety protocols were in place, including screening questions prior to the visit, additional usage of staff  PPE, and extensive cleaning of exam room while observing appropriate contact time as indicated for disinfecting solutions.

## 2021-01-28 NOTE — Assessment & Plan Note (Signed)
Pt notes feeling "off" unclear what the cause of this is. Home values on wrist cuff 170-190/100-115. He will monitor at home and bring cuff to nurse visit. If still elevated at home will start bp medication. If borderline will plan exercise, healthy diet, weight loss.

## 2021-01-28 NOTE — Patient Instructions (Signed)
Only get more tea if you drank the same amount of water  How to help anxiety - without medication.   1) Regular Exercise - walking, jogging, cycling, dancing, strength training --> Yoga has been shown in research to reduce depression and anxiety -- with even just one hour long session per week  2)  Begin a Mindfulness/Meditation practice -- this can take a little as 3 minutes and is helpful for all kinds of mood issues -- You can find resources in books -- Or you can download apps like  ---- Headspace App (which currently has free content called "Weathering the Storm") ---- Calm (which has a few free options)  ---- Insignt Timer ---- Stop, Breathe & Think  # With each of these Apps - you should decline the "start free trial" offer and as you search through the App should be able to access some of their free content. You can also chose to pay for the content if you find one that works well for you.   # Many of them also offer sleep specific content which may help with insomnia  3) Healthy Diet -- Avoid or decrease Caffeine -- Avoid or decrease Alcohol -- Drink plenty of water, have a balanced diet -- Avoid cigarettes and marijuana (as well as other recreational drugs)  4) Consider contacting a professional therapist  -- [facility] is one option. Call [phone] -- Or you can check out [email or web address] -- you can read bios of therapists and see if they accept insurance -- Check with your insurance to see if you have coverage and who may take your insurance    Your blood pressure high.   High blood pressure increases your risk for heart attack and stroke.    Please check your blood pressure 2-4 times a week.   To check your blood pressure 1) Sit in a quiet and relaxed place for 5 minutes 2) Make sure your feet are flat on the ground 3) Consider checking first thing in the morning   Normal blood pressure is less than 140/90 Ideally you blood pressure  should be around 120/80  Other ways you can reduce your blood pressure:  1) Regular exercise -- Try to get 150 minutes (30 minutes, 5 days a week) of moderate to vigorous aerobic excercise -- Examples: brisk walking (2.5 miles per hour), water aerobics, dancing, gardening, tennis, biking slower than 10 miles per hour 2) DASH Diet - low fat meats, more fresh fruits and vegetables, whole grains, low salt 3) Quit smoking if you smoke 4) Loose 5-10% of your body weight  Schedule nurse visit to compare home cuff

## 2021-01-28 NOTE — Assessment & Plan Note (Signed)
Encouraged lifestyle changes to help with anxiety including drinking more water (may be caffeine related). Handout provided including number for therapy.

## 2021-01-28 NOTE — Assessment & Plan Note (Signed)
Pt notes feeling "off". He will increase water intake, continue healthy diet, add exercise. Work to lose 10-15 lbs. Anxiety/BP as noted

## 2021-01-31 ENCOUNTER — Telehealth: Payer: Self-pay | Admitting: Family Medicine

## 2021-01-31 DIAGNOSIS — I10 Essential (primary) hypertension: Secondary | ICD-10-CM

## 2021-01-31 NOTE — Telephone Encounter (Signed)
Spoke to pt and relayed Dr. Elmyra Ricks message. Pt recalled the conversation when I reminded him.  Pt states that he had an episode of not feeling well this morning and his BP was 189/110, but he had just eaten a country ham biscuit. He regretted eating that and says he is feeling much better now.  Pt scheduled a NV for 02/20/21 for a BP check.

## 2021-01-31 NOTE — Telephone Encounter (Signed)
His blood pressure was in the normal range.   We discussed proper home blood pressure measurement and he was advised to bring his cuff to a nurse visit in 2-[redacted] weeks along with home monitoring.   No medications were prescribed.

## 2021-01-31 NOTE — Telephone Encounter (Signed)
Mr. Jesus Montes called in and he was seen earlier this week and him and Dr. Selena Batten were discussing medication for his BP, but he thought that he was getting medication and his wife went to the store to pick it up and nothing was there.

## 2021-02-03 MED ORDER — LISINOPRIL 10 MG PO TABS: 10.0000 mg | ORAL_TABLET | Freq: Every day | ORAL | 3 refills | Status: DC

## 2021-02-03 NOTE — Telephone Encounter (Signed)
Given his persistently symptomatic high blood pressures it may be reasonable to start something.   I've ordered lisinopril 10 mg.   If blood pressure remaining >160/90 on home monitoring with diet changes I would recommend starting.   Keep nurse visit and bring cuff and log

## 2021-02-03 NOTE — Telephone Encounter (Signed)
Attempted to reach pt but his cell VM is not set up. Home phone rings busy. Will try again later.

## 2021-02-03 NOTE — Addendum Note (Signed)
Addended by: Gweneth Dimitri R on: 02/03/2021 11:26 AM   Modules accepted: Orders

## 2021-02-04 NOTE — Telephone Encounter (Signed)
Patient advised as instructed 

## 2021-02-20 ENCOUNTER — Ambulatory Visit: Payer: BC Managed Care – PPO

## 2021-08-22 ENCOUNTER — Telehealth: Payer: Self-pay

## 2021-08-22 NOTE — Telephone Encounter (Signed)
Would strongly recommend him scheduling with me.  ? ?I have a 2 pm on Monday.  ? ?If afternoon does not work I could see him at 11 am or 10:20 am.  ? ? ?

## 2021-08-22 NOTE — Telephone Encounter (Signed)
[facility] Day - Client ?TELEPHONE ADVICE RECORD ?AccessNurse? ?Patient ?Name: ?Alcee PE ?ARSON ?Gender: Male ?DOB: June 18, 1986 ?Age: 36 Y 5 M 12 D ?Return ?Phone ?Number: ?[id number] ?(Primary), ?[id number] ?(Secondary) ?Address: ?City/ ?State/ ?Zip: ?[place] ? [address] ?Client [facility] Day - Client ?Client Site [facility] - Day ?Provider Gweneth Dimitri- MD ?Contact Type Call ?Who Is Calling Patient / Member / Family / Caregiver ?Call Type Triage / Clinical ?Relationship To Patient Self ?Return Phone Number [phone] (Primary) ?Chief Complaint BLOOD PRESSURE HIGH - Diastolic (bottom ?number) 120 or greater ?Reason for Call Symptomatic / Request for Health Information ?Initial Comment Caller states that his blood pressure is 167/121, he ?feels heat in his face, blurry vision, and nauseous. ?He missed his blood pressure twice this week and ?he has quit smoking about a week ago. ?Translation No ?Nurse Assessment ?Nurse: D'Heur Ezzard Standing, RN, Adrienne Date/Time (Eastern Time): 08/22/2021 8:41:52 AM ?Confirm and document reason for call. If ?symptomatic, describe symptoms. ?---Caller states that his blood pressure is 167/121, ?(pulse is 76); he feels heat in his face, brief episode ?of blurry vision, and nausea. He missed his blood ?pressure twice this week and he has quit smoking ?about a week ago; he is using something to help w/ ?quitting. ?Does the patient have any new or worsening ?symptoms? ---Yes ?Will a triage be completed? ---Yes ?Related visit to physician within the last 2 weeks? ---No ?Does the PT have any chronic conditions? (i.e. ?diabetes, asthma, this includes High risk factors for ?pregnancy, etc.) ?---Yes ?List chronic conditions. ---HTN, ?Is this a behavioral health or substance abuse call? ---No ?Guidelines ?Guideline Title Affirmed Question Affirmed Notes Nurse Date/Time (Eastern ?Time) ?Blood Pressure - ?High ?Systolic BP >=  180 ?OR Diastolic >= 110 ?D'Heur Ezzard Standing, ?RN, Hansel Starling ?08/22/2021 8:44:58 AM ?PLEASE NOTE: All timestamps contained within this report are represented as [place] Standard Time. ?CONFIDENTIALTY NOTICE: This fax transmission is intended only for the addressee. It contains information that is legally privileged, confidential or ?otherwise protected from use or disclosure. If you are not the intended recipient, you are strictly prohibited from reviewing, disclosing, copying using ?or disseminating any of this information or taking any action in reliance on or regarding this information. If you have received this fax in error, please ?notify us immediately by telephone so that we can arrange for its return to [place]. Phone: [phone], Toll-Free: [phone], Fax: [phone] ?Page: 2 of 2 ?Call Id: [id number] ?Disp. Time (Eastern ?Time) Disposition Final User ?08/22/2021 8:40:19 AM Send to Urgent Bernerd Pho, Clydie Braun ?08/22/2021 8:49:07 AM See PCP within 24 Hours Yes D'Heur Ezzard Standing, RN, Hansel Starling ?Caller Disagree/Comply Comply ?Caller Understands Yes ?PreDisposition Call Doctor ?Care Advice Given Per Guideline ?SEE PCP WITHIN 24 HOURS: * IF OFFICE WILL BE OPEN: You need to be examined within the next 24 hours. Call your doctor ?(or NP/PA) when the office opens and make an appointment. HIGH BLOOD PRESSURE: * Untreated high blood pressure may ?cause damage to your heart, brain, kidneys, and eyes. * The goal of blood pressure treatment for most people with hypertension is to ?keep the blood pressure under 140/90. For people that are 60 years or older, your doctor may instead want to keep the blood pressure ?under 150/90. CALL BACK IF: * Weakness or numbness of the face, arm or leg on one side of the body occurs * Difficulty walking, ?difficulty talking, or severe headache occurs * Chest pain or difficulty breathing occurs * You become worse  CARE ADVICE given ?per High Blood Pressure (Adult) guideline. ?Referrals ?REFERRED TO PCP  OFFIC ?

## 2021-08-22 NOTE — Telephone Encounter (Signed)
Patient called office with elevated blood pressure for last two days. Average around 167/121. Has had redness and flushing along with flushing and some blurred vision. I have sent patient call to access nurse for triage.  ?

## 2021-08-22 NOTE — Telephone Encounter (Signed)
Agree with plan for appropriate triage for patient given elevated blood pressure and symptoms. ? ?Depending on outcome would recommend follow-up next week. ?

## 2021-08-22 NOTE — Telephone Encounter (Signed)
I spoke with pt and he changed his appt to Dr Einar Pheasant on 08/25/21 at 11 AM. Appt with Eugenia Pancoast FNP was cancelled. Reviewed again UC & ED precautions and pt voiced understanding and will be seen over weekend if needed. Pt appreciative of out concern. Sending note to Dr Einar Pheasant who is out of office this afternoon, Eugenia Pancoast FNP and [place] CMA.Marland Kitchen ?

## 2021-08-22 NOTE — Telephone Encounter (Signed)
I spoke with Jesus Montes;Jesus Montes said that he had missed a couple of times taking lisinopril earlier in the week and Jesus Montes has been under more stress than usual due to stopping smoking. Jesus Montes already has appt with Hayden Pedro FNP on 08/25/21 at 11:20. Jesus Montes said he took his lisinopril this morning and BP now is 158/89. Jesus Montes said he will wait for appt on Mon at [facility]. UC & ED precautions given and Jesus Montes voiced understanding and said he would go over weekend if needed. Sending note to Dr Selena Batten as PCP and Hayden Pedro FNP and [facility] CMA. ?

## 2021-08-25 ENCOUNTER — Ambulatory Visit (INDEPENDENT_AMBULATORY_CARE_PROVIDER_SITE_OTHER): Payer: BC Managed Care – PPO | Admitting: Family Medicine

## 2021-08-25 ENCOUNTER — Other Ambulatory Visit: Payer: Self-pay

## 2021-08-25 ENCOUNTER — Ambulatory Visit: Payer: BC Managed Care – PPO | Admitting: Family

## 2021-08-25 VITALS — BP 108/62 | HR 71 | Temp 98.2°F | Ht 68.0 in | Wt 234.2 lb

## 2021-08-25 DIAGNOSIS — I10 Essential (primary) hypertension: Secondary | ICD-10-CM | POA: Diagnosis not present

## 2021-08-25 DIAGNOSIS — E669 Obesity, unspecified: Secondary | ICD-10-CM

## 2021-08-25 DIAGNOSIS — R232 Flushing: Secondary | ICD-10-CM | POA: Diagnosis not present

## 2021-08-25 LAB — LIPID PANEL
Cholesterol: 161 mg/dL (ref 0–200)
HDL: 37.8 mg/dL — ABNORMAL LOW (ref >39.00)
NonHDL: 122.96
Total CHOL/HDL Ratio: 4
Triglycerides: 266 mg/dL — ABNORMAL HIGH (ref 0.0–149.0)
VLDL: 53.2 mg/dL — ABNORMAL HIGH (ref 0.0–40.0)

## 2021-08-25 LAB — CBC WITH DIFFERENTIAL/PLATELET
Basophils Absolute: 0.1 10*3/uL (ref 0.0–0.1)
Basophils Relative: 0.6 % (ref 0.0–3.0)
Eosinophils Absolute: 0.1 10*3/uL (ref 0.0–0.7)
Eosinophils Relative: 1.5 % (ref 0.0–5.0)
HCT: 48.5 % (ref 39.0–52.0)
Hemoglobin: 16.4 g/dL (ref 13.0–17.0)
Lymphocytes Relative: 31.6 % (ref 12.0–46.0)
Lymphs Abs: 2.5 10*3/uL (ref 0.7–4.0)
MCHC: 33.9 g/dL (ref 30.0–36.0)
MCV: 92.6 fl (ref 78.0–100.0)
Monocytes Absolute: 0.7 10*3/uL (ref 0.1–1.0)
Monocytes Relative: 9.3 % (ref 3.0–12.0)
Neutro Abs: 4.5 10*3/uL (ref 1.4–7.7)
Neutrophils Relative %: 57 % (ref 43.0–77.0)
Platelets: 231 10*3/uL (ref 150.0–400.0)
RBC: 5.24 Mil/uL (ref 4.22–5.81)
RDW: 13.2 % (ref 11.5–15.5)
WBC: 7.9 10*3/uL (ref 4.0–10.5)

## 2021-08-25 LAB — COMPREHENSIVE METABOLIC PANEL
ALT: 63 U/L — ABNORMAL HIGH (ref 0–53)
AST: 27 U/L (ref 0–37)
Albumin: 4.7 g/dL (ref 3.5–5.2)
Alkaline Phosphatase: 58 U/L (ref 39–117)
BUN: 9 mg/dL (ref 6–23)
CO2: 29 mEq/L (ref 19–32)
Calcium: 9.8 mg/dL (ref 8.4–10.5)
Chloride: 101 mEq/L (ref 96–112)
Creatinine, Ser: 0.99 mg/dL (ref 0.40–1.50)
GFR: 100.12 mL/min (ref >60.00)
Glucose, Bld: 79 mg/dL (ref 70–99)
Potassium: 4.4 mEq/L (ref 3.5–5.1)
Sodium: 138 mEq/L (ref 135–145)
Total Bilirubin: 0.6 mg/dL (ref 0.2–1.2)
Total Protein: 7.2 g/dL (ref 6.0–8.3)

## 2021-08-25 LAB — TSH: TSH: 1.61 u[IU]/mL (ref 0.35–5.50)

## 2021-08-25 LAB — LDL CHOLESTEROL, DIRECT: Direct LDL: 102 mg/dL

## 2021-08-25 NOTE — Assessment & Plan Note (Signed)
Compared with home wrist cuff, suspect inaccurate wrist cuff at home.  Advised getting arm cuff and monitoring blood pressure over the next couple of weeks.  Continue lisinopril 10 mg.  Avoid salty foods.  Continue to avoid nicotine.  Follow-up 3 months. ?

## 2021-08-25 NOTE — Progress Notes (Signed)
? ?Subjective:  ? ?  ?Jesus Montes is a 36 y.o. male presenting for Hypertension (167/121 , 153/104, 145/80, 158/126 readings since Friday ) ?  ? ? ?Hypertension ? ? ?#HTN ?- was noticing elevated blood pressures at home ?- was having readings as above ?- 167/121 - before medication ?- midday after medication 153/104 ?- Last night ?- very stressful job  ?- took off Friday  ?- has been trying to reduce stress at work  ?- on lisinopril 10 mg  ?- has been checking with a wrist cuff at home - not holding to the level of his heart ?- will check his bp after laying flat for 5 minutes ?- last week also quit nicotine - Thursday was the first day w/o it ?- was using caffeine free dip and tobacco free ?- last week was the first week w/o nicotine ?- started getting a headache ?- Wednesday/Thursday night started having some underarm pain - and short episode of blurry vision ?- will get a hotness in the neck/face before he takes his medication and this was constant Thursday night ?- has been trying to eat healthy but did eat a salty meal (pork ribs)  ?- will have episodes where his bp gets extremely high  ? ?Getting heat symptoms and nightsweats but only 1 day ? ?Review of Systems ? ? ?Social History  ? ?Tobacco Use  ?Smoking Status Never  ?Smokeless Tobacco Never  ? ? ? ?   ?Objective:  ?  ?BP Readings from Last 3 Encounters:  ?08/25/21 108/62  ?01/28/21 138/90  ?10/23/20 120/80  ? ?Wt Readings from Last 3 Encounters:  ?08/25/21 234 lb 4 oz (106.3 kg)  ?01/28/21 240 lb (108.9 kg)  ?10/23/20 239 lb 8 oz (108.6 kg)  ? ? ?BP 108/62   Pulse 71   Temp 98.2 ?F (36.8 ?C) (Oral)   Ht 5\' 8"  (1.727 m)   Wt 234 lb 4 oz (106.3 kg)   SpO2 97%   BMI 35.62 kg/m?  ? ? ?Physical Exam ?Constitutional:   ?   Appearance: Normal appearance. He is not ill-appearing or diaphoretic.  ?HENT:  ?   Right Ear: External ear normal.  ?   Left Ear: External ear normal.  ?   Nose: Nose normal.  ?Eyes:  ?   General: No scleral icterus. ?    Extraocular Movements: Extraocular movements intact.  ?   Conjunctiva/sclera: Conjunctivae normal.  ?Cardiovascular:  ?   Rate and Rhythm: Normal rate and regular rhythm.  ?   Heart sounds: No murmur heard. ?Pulmonary:  ?   Effort: Pulmonary effort is normal. No respiratory distress.  ?   Breath sounds: Normal breath sounds. No wheezing.  ?Musculoskeletal:  ?   Cervical back: Neck supple.  ?Skin: ?   General: Skin is warm and dry.  ?Neurological:  ?   Mental Status: He is alert. Mental status is at baseline.  ?Psychiatric:     ?   Mood and Affect: Mood normal.  ? ? ? ? ? ?   ?Assessment & Plan:  ? ?Problem List Items Addressed This Visit   ? ?  ? Cardiovascular and Mediastinum  ? Essential hypertension - Primary  ?  Compared with home wrist cuff, suspect inaccurate wrist cuff at home.  Advised getting arm cuff and monitoring blood pressure over the next couple of weeks.  Continue lisinopril 10 mg.  Avoid salty foods.  Continue to avoid nicotine.  Follow-up 3 months. ?  ?  ?  Relevant Orders  ? Comprehensive metabolic panel  ? CBC with Differential  ? TSH  ? Metanephrines, urine, 24 hour  ? Catecholamines, fractionated, urine, 24 hour  ? Flushing  ?  Patient notes recurrent history of flushing associated with high blood pressure and generalized feeling unwell.  Discussed with abnormal blood pressure readings in constellation of symptoms would be reasonable to work-up for pheochromocytoma.  Urine studies ordered today. ?  ?  ? Relevant Orders  ? TSH  ? Metanephrines, urine, 24 hour  ? Catecholamines, fractionated, urine, 24 hour  ?  ? Other  ? Obesity (BMI 30-39.9)  ?  Continue to work on [employer], labs today. ?  ?  ? Relevant Orders  ? Lipid panel  ? ? ? ?Return in about 3 months (around 11/25/2021) for annual physical exam. ? ?Lynnda Child, MD ? ?This visit occurred during the SARS-CoV-2 public health emergency.  Safety protocols were in place, including screening questions prior to the visit, additional usage of  staff PPE, and extensive cleaning of exam room while observing appropriate contact time as indicated for disinfecting solutions.  ? ?

## 2021-08-25 NOTE — Patient Instructions (Signed)
Blood pressure ?- Continue lisinopril ?- labs today ?- work-up for pheochromocytoma (urine test)  ?- Get a arm cuff and monitor at home  ?- monitor with arm cuff and send readings in 1-2 weeks  ? ? ?

## 2021-08-25 NOTE — Assessment & Plan Note (Signed)
Continue to work on [employer], labs today. ?

## 2021-08-25 NOTE — Assessment & Plan Note (Signed)
Patient notes recurrent history of flushing associated with high blood pressure and generalized feeling unwell.  Discussed with abnormal blood pressure readings in constellation of symptoms would be reasonable to work-up for pheochromocytoma.  Urine studies ordered today. ?

## 2021-09-04 ENCOUNTER — Encounter: Payer: BC Managed Care – PPO | Admitting: Family Medicine

## 2021-11-27 ENCOUNTER — Ambulatory Visit (INDEPENDENT_AMBULATORY_CARE_PROVIDER_SITE_OTHER): Payer: BC Managed Care – PPO | Admitting: Family Medicine

## 2021-11-27 VITALS — BP 110/72 | HR 75 | Temp 97.6°F | Ht 66.75 in | Wt 225.4 lb

## 2021-11-27 DIAGNOSIS — F4321 Adjustment disorder with depressed mood: Secondary | ICD-10-CM | POA: Diagnosis not present

## 2021-11-27 DIAGNOSIS — R7989 Other specified abnormal findings of blood chemistry: Secondary | ICD-10-CM | POA: Diagnosis not present

## 2021-11-27 DIAGNOSIS — I10 Essential (primary) hypertension: Secondary | ICD-10-CM | POA: Diagnosis not present

## 2021-11-27 DIAGNOSIS — Z0000 Encounter for general adult medical examination without abnormal findings: Secondary | ICD-10-CM | POA: Diagnosis not present

## 2021-11-27 MED ORDER — LISINOPRIL 10 MG PO TABS: 10.0000 mg | ORAL_TABLET | Freq: Every day | ORAL | 3 refills | Status: DC

## 2021-11-27 NOTE — Progress Notes (Signed)
Annual Exam   Chief Complaint:  Chief Complaint  Patient presents with   Annual Exam    No concerns     History of Present Illness:  Jesus Montes is a 36 y.o. presents today for annual examination.    #HTN - taking medication lisionopril  Nutrition/Lifestyle Diet: cut out bojangles Exercise: moving recently He is single partner, contraception - surgical.  Any issues getting or maintaining an erection? no  Social History   Tobacco Use  Smoking Status Never  Smokeless Tobacco Never   Social History   Substance and Sexual Activity  Alcohol Use Yes   Comment: once every few months   Social History   Substance and Sexual Activity  Drug Use No     Safety The patient wears seatbelts: yes.     The patient feels safe at home and in their relationships: yes.  General Health Dentist in the last year: Yes Eye doctor: no  Weight Wt Readings from Last 3 Encounters:  11/27/21 225 lb 6 oz (102.2 kg)  08/25/21 234 lb 4 oz (106.3 kg)  01/28/21 240 lb (108.9 kg)   Patient has high BMI  BMI Readings from Last 1 Encounters:  11/27/21 35.56 kg/m     Chronic disease screening Blood pressure monitoring:  BP Readings from Last 3 Encounters:  11/27/21 110/72  08/25/21 108/62  01/28/21 138/90    Lipid Monitoring: Indication for screening: age >35, obesity, diabetes, family hx, CV risk factors.  Lipid screening: Yes  Lab Results  Component Value Date   CHOL 161 08/25/2021   HDL 37.80 (L) 08/25/2021   LDLCALC 89 08/16/2020   LDLDIRECT 102.0 08/25/2021   TRIG 266.0 (H) 08/25/2021   CHOLHDL 4 08/25/2021     Diabetes Screening: age >39, overweight, family hx, PCOS, hx of gestational diabetes, at risk ethnicity, elevated blood pressure >135/80.  Diabetes Screening screening: Not Indicated  Lab Results  Component Value Date   HGBA1C 5.4 08/16/2020      There is no immunization history on file for this patient.  Past Medical History:  Diagnosis Date    Seizures (HCC)    in elementary school due to blood sugar    Past Surgical History:  Procedure Laterality Date   TYMPANOPLASTY     VASECTOMY      Prior to Admission medications   Medication Sig Start Date End Date Taking? Authorizing Provider  lisinopril (ZESTRIL) 10 MG tablet Take 1 tablet (10 mg total) by mouth daily. 02/03/21  Yes Lynnda Child, MD  meclizine (ANTIVERT) 25 MG tablet Take 1 tablet (25 mg total) by mouth 3 (three) times daily as needed for dizziness. 10/23/20  Yes Lynnda Child, MD  ondansetron (ZOFRAN) 4 MG tablet Take 1 tablet (4 mg total) by mouth every 8 (eight) hours as needed for nausea or vomiting. 10/23/20  Yes Lynnda Child, MD    No Known Allergies   Social History   Socioeconomic History   Marital status: Married    Spouse name: Chelsey   Number of children: 4   Years of education: high school   Highest education level: Not on file  Occupational History   Not on file  Tobacco Use   Smoking status: Never   Smokeless tobacco: Never  Vaping Use   Vaping Use: Every day  Substance and Sexual Activity   Alcohol use: Yes    Comment: once every few months   Drug use: No   Sexual activity: Yes  Birth control/protection: Surgical  Other Topics Concern   Not on file  Social History Narrative   05/21/20   From: the area   Living: with wife, Chelsey (2018)   Work: Product manager      Family: 4 kids - Jae Dire (2010), Kylie (2011) (partial custody), Harrison Mons (2013), and Maurine Minister (2018)       Enjoys: hunting, hiking, gaming w/ friends      Exercise: during hunting season - every other day is hunting - 4 miles + walking   Diet: not currently, not great - 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   Seat belts: Yes    Guns: Yes  and secure   Safe in relationships: Yes    Social Determinants of Corporate investment banker Strain: Not on file  Food Insecurity: Not on file  Transportation Needs: Not on file  Physical Activity:  Not on file  Stress: Not on file  Social Connections: Not on file  Intimate Partner Violence: Not on file    Family History  Problem Relation Age of Onset   Asthma Mother    Heart attack Father 76   Asthma Son    Heart disease Paternal Uncle    Heart attack Paternal Uncle 35   Heart disease Maternal Grandmother        stent placement   Parkinson's disease Maternal Grandfather    Heart attack Paternal Grandfather 90   Heart attack Paternal Uncle 35   Heart attack Paternal Uncle    Heart attack Paternal Uncle    Heart attack Paternal Uncle     Review of Systems  Constitutional:  Negative for chills and fever.  HENT:  Negative for congestion and sore throat.   Eyes:  Negative for blurred vision and double vision.  Respiratory:  Negative for shortness of breath.   Cardiovascular:  Negative for chest pain.  Gastrointestinal:  Negative for heartburn, nausea and vomiting.  Genitourinary: Negative.   Musculoskeletal: Negative.  Negative for myalgias.  Skin:  Negative for rash.  Neurological:  Positive for dizziness (vertigo, 5 weeks ago). Negative for headaches.  Endo/Heme/Allergies:  Does not bruise/bleed easily.  Psychiatric/Behavioral:  Negative for depression. The patient is not nervous/anxious.      Physical Exam BP 110/72   Pulse 75   Temp 97.6 F (36.4 C) (Temporal)   Ht 5' 6.75" (1.695 m)   Wt 225 lb 6 oz (102.2 kg)   SpO2 98%   BMI 35.56 kg/m    BP Readings from Last 3 Encounters:  11/27/21 110/72  08/25/21 108/62  01/28/21 138/90      Physical Exam Constitutional:      General: He is not in acute distress.    Appearance: He is well-developed. He is not diaphoretic.  HENT:     Head: Normocephalic and atraumatic.     Right Ear: Tympanic membrane and ear canal normal.     Left Ear: Tympanic membrane and ear canal normal.     Nose: Nose normal.     Mouth/Throat:     Pharynx: Uvula midline.  Eyes:     General: No scleral icterus.     Conjunctiva/sclera: Conjunctivae normal.     Pupils: Pupils are equal, round, and reactive to light.  Cardiovascular:     Rate and Rhythm: Normal rate and regular rhythm.     Heart sounds: Normal heart sounds. No murmur heard. Pulmonary:     Effort: Pulmonary effort is  normal. No respiratory distress.     Breath sounds: Normal breath sounds. No wheezing.  Abdominal:     General: Bowel sounds are normal. There is no distension.     Palpations: Abdomen is soft. There is no mass.     Tenderness: There is no abdominal tenderness. There is no guarding.     Comments: Right upper quadrant/side with possible small mass ?lipoma difficult to assess.   Musculoskeletal:        General: Normal range of motion.     Cervical back: Normal range of motion and neck supple.  Lymphadenopathy:     Cervical: No cervical adenopathy.  Skin:    General: Skin is warm and dry.     Capillary Refill: Capillary refill takes less than 2 seconds.  Neurological:     Mental Status: He is alert and oriented to person, place, and time.        Results:  PHQ-9:  Flowsheet Row [facility] Visit [facility] [place]  PHQ-9 Total Score 17         Assessment: 36 y.o. here for routine annual physical examination.  Plan: Problem List Items Addressed This Visit       Cardiovascular and Mediastinum   Essential hypertension   Relevant Medications   lisinopril (ZESTRIL) 10 MG tablet     Other   Adjustment disorder with depressed mood    More recent life stressors. Encouraged therapy - he declined. Handout for non-medication approaches and advised f/u if not improving      Elevated LFTs    US of liver also to assess side mass of concern      Relevant Orders   US ABDOMEN LIMITED RUQ (LIVER/GB)   Other Visit Diagnoses     Annual physical exam    -  Primary       Screening: -- Blood pressure screen  controlled -- cholesterol screening: not due for screening -- Weight  screening: overweight: continue to monitor -- Diabetes Screening: not due for screening -- Nutrition: Encouraged healthy diet and exercise  The ASCVD Risk score (Arnett DK, et al., 2019) failed to calculate for the following reasons:   The 2019 ASCVD risk score is only valid for ages 3140 to 3379  -- Statin therapy for Age 34-75 with CVD risk >7.5%  Psych -- Depression screening (PHQ-9):  Flowsheet Row Office Visit [facility] [place]  PHQ-9 Total Score 17     -- significant life stressors recently - house hunting, moving,    Safety -- tobacco screening:  vaping - working on quitting -- alcohol screening:  low-risk usage. -- no evidence of domestic violence or intimate partner violence.   Cancer Screening -- No age related cancer screening due  Immunizations  There is no immunization history on file for this patient.  -- flu vaccine not in season -- TDAP q10 years not up to date - declined --- Covid-19 Vaccine declined   Encouraged regular vision and dental screening. Encouraged healthy exercise and diet.   Lynnda ChildJessica R Shontez Sermon

## 2021-11-27 NOTE — Patient Instructions (Addendum)
Your cholesterol is high.  You can work to lower cholesterol through diet and exercise.  I would recommend considering the following:   1) Diet -Mediterranean diet, vegetarian or meat restricted diet, low carbohydrate diet, avoiding trans fats, or the Dash diet.   2) You can consider trying omega-3 fatty acids or red yeast rice which may have some benefit.  But dietary changes are better than supplements.  3) Exercise - I recommended 30 minutes of brisk walking 5-7 days a week   Reach out if depression and anxiety symptoms do not improve with reduction in stress  I will order a liver ultrasound   How to help anxiety - without medication.   1) Regular Exercise - walking, jogging, cycling, dancing, strength training --> Yoga has been shown in research to reduce depression and anxiety -- with even just one hour long session per week  2)  Begin a Mindfulness/Meditation practice -- this can take a little as 3 minutes and is helpful for all kinds of mood issues -- You can find resources in books -- Or you can download apps like  ---- Headspace App (which currently has free content called "Weathering the Storm") ---- Calm (which has a few free options)  ---- Insignt Timer ---- Stop, Breathe & Think  # With each of these Apps - you should decline the "start free trial" offer and as you search through the App should be able to access some of their free content. You can also chose to pay for the content if you find one that works well for you.   # Many of them also offer sleep specific content which may help with insomnia  3) Healthy Diet -- Avoid or decrease Caffeine -- Avoid or decrease Alcohol -- Drink plenty of water, have a balanced diet -- Avoid cigarettes and marijuana (as well as other recreational drugs)  4) Consider contacting a professional therapist  -- [place] is one option. Call [phone] -- Or you can check out www.psychologytoday.com -- you can  read bios of therapists and see if they accept insurance -- Check with your insurance to see if you have coverage and who may take your insurance

## 2021-11-27 NOTE — Assessment & Plan Note (Signed)
[place] of liver also to assess side mass of concern

## 2021-11-27 NOTE — Assessment & Plan Note (Signed)
More recent life stressors. Encouraged therapy - he declined. Handout for non-medication approaches and advised f/u if not improving

## 2021-12-22 ENCOUNTER — Ambulatory Visit
Admission: RE | Admit: 2021-12-22 | Discharge: 2021-12-22 | Disposition: A | Discharge status: Home / Self Care | Payer: BC Managed Care – PPO | Source: Ambulatory Visit | Attending: Family Medicine | Admitting: Family Medicine

## 2021-12-22 ENCOUNTER — Telehealth: Payer: Self-pay

## 2021-12-22 DIAGNOSIS — R7989 Other specified abnormal findings of blood chemistry: Secondary | ICD-10-CM

## 2021-12-22 DIAGNOSIS — R945 Abnormal results of liver function studies: Secondary | ICD-10-CM | POA: Diagnosis not present

## 2021-12-22 DIAGNOSIS — R1900 Intra-abdominal and pelvic swelling, mass and lump, unspecified site: Secondary | ICD-10-CM

## 2021-12-22 NOTE — Telephone Encounter (Signed)
[facility] received order for 2 things, but due to insurance reasons, they could only do one of the [place]. Please place a new order for an US Abdomen limited for palpable mass R abdomen and they will get pt scheduled for that second imaging.

## 2021-12-22 NOTE — Telephone Encounter (Signed)
Noted. Order placed.

## 2022-03-16 IMAGING — CT CT HEAD W/O CM
3 of 4 series · 14 of 47 positions shown, 16 images · non-contrast
Comparison: None.

CLINICAL DATA: Neuro deficit, acute, stroke suspected Dizziness,
non-specific

Dizziness and vomiting.
EXAM:
CT HEAD WITHOUT CONTRAST
TECHNIQUE: Contiguous axial images were obtained from the base of the skull
through the vertex without intravenous contrast.

[Series 4: head 2.0 h70h · axial · 0.44mm/px · z∈[-75,+49]mm · 8 of 78 slices shown, 10 images]
[im 8/78  brain]
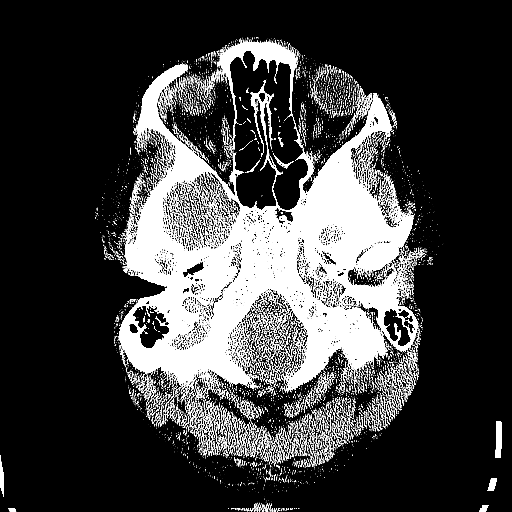
[im 8/78  bone]
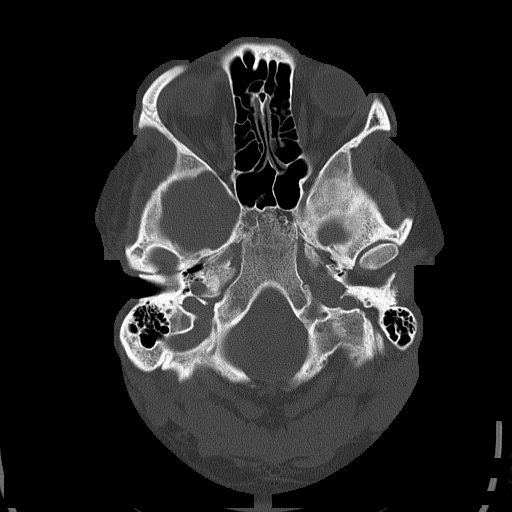
[im 16/78  brain]
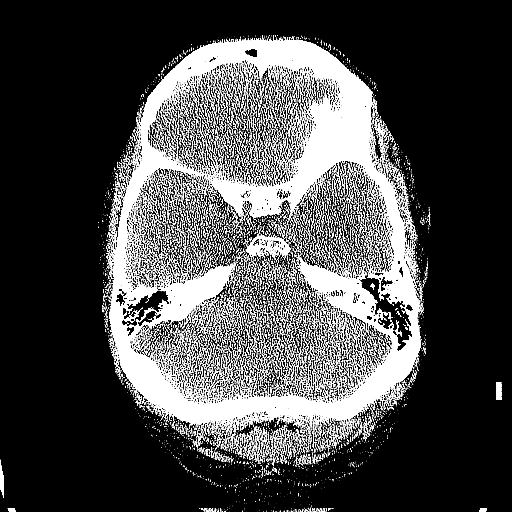
[im 24/78  brain]
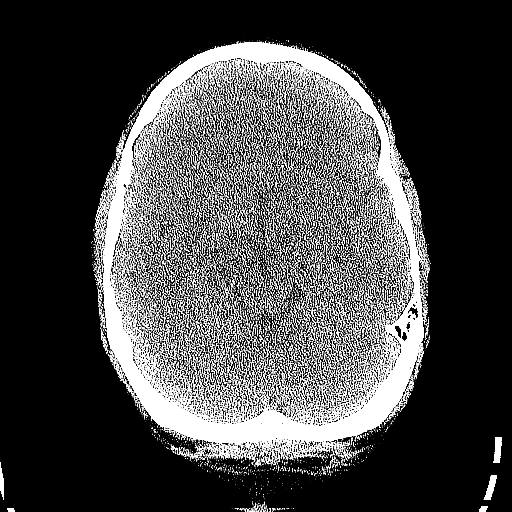
[im 35/78  brain]
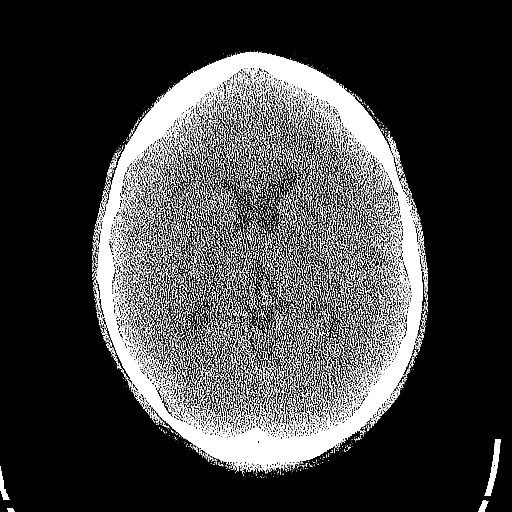
[im 43/78  brain]
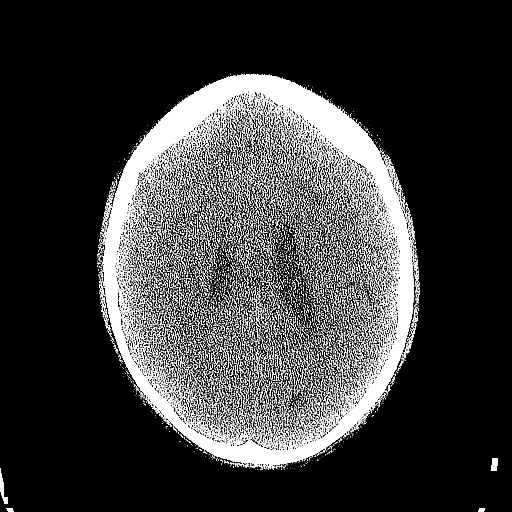
[im 43/78  bone]
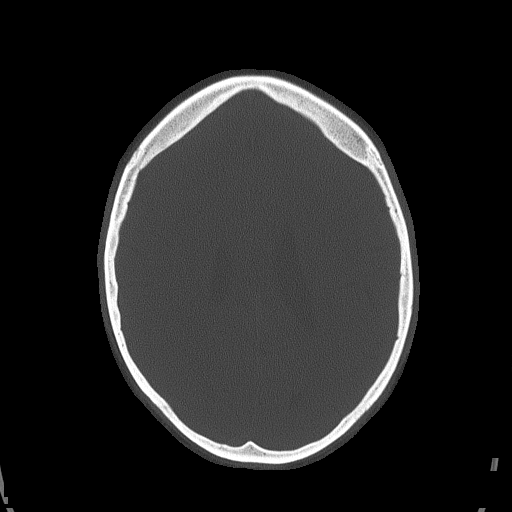
[im 54/78  brain]
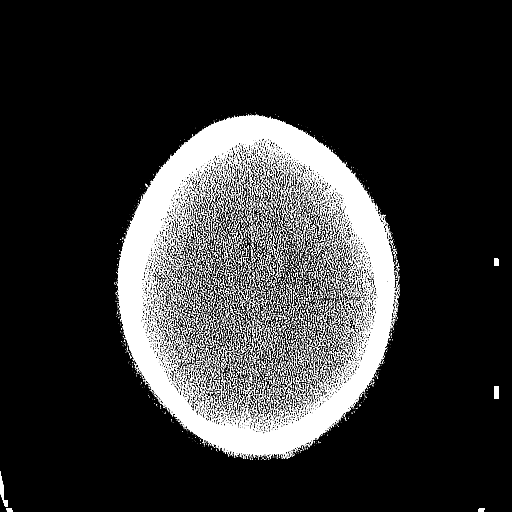
[im 62/78  brain]
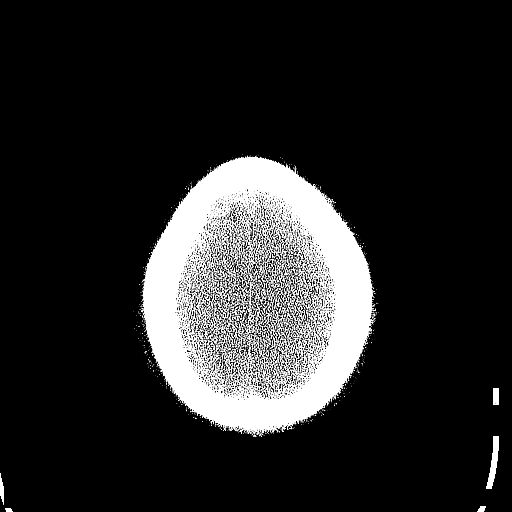
[im 70/78  brain]
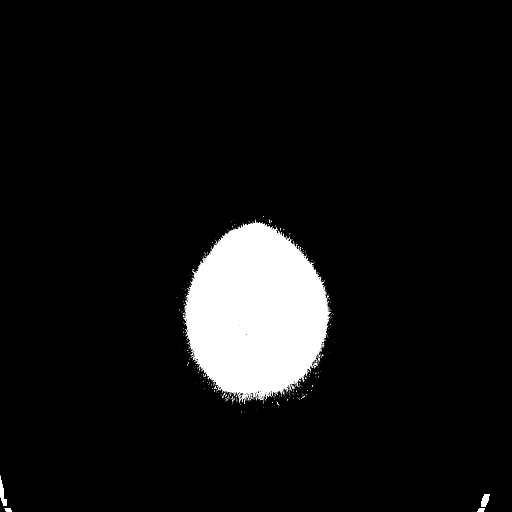

[Series 5: head 3.0 mpr cor · coronal · 0.32mm/px · 3 of 74 slices shown]
[im 25/74  brain]
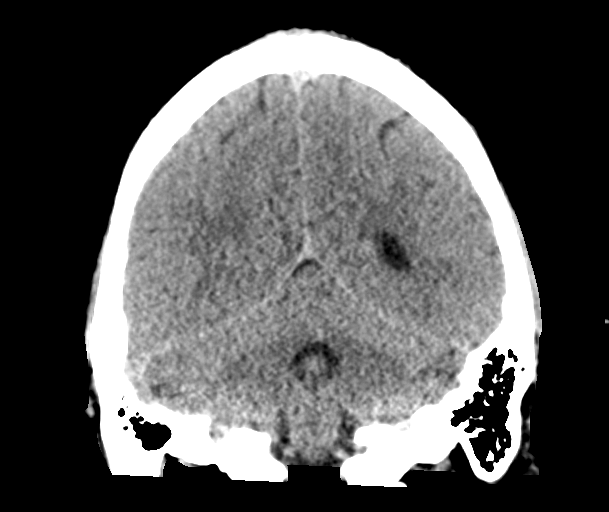
[im 33/74  brain]
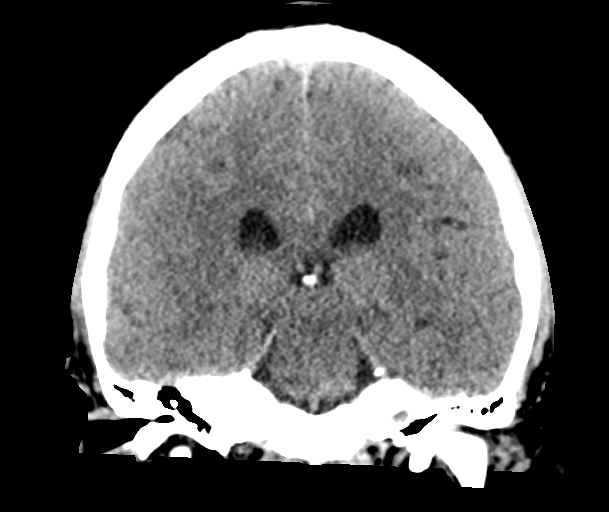
[im 41/74  brain]
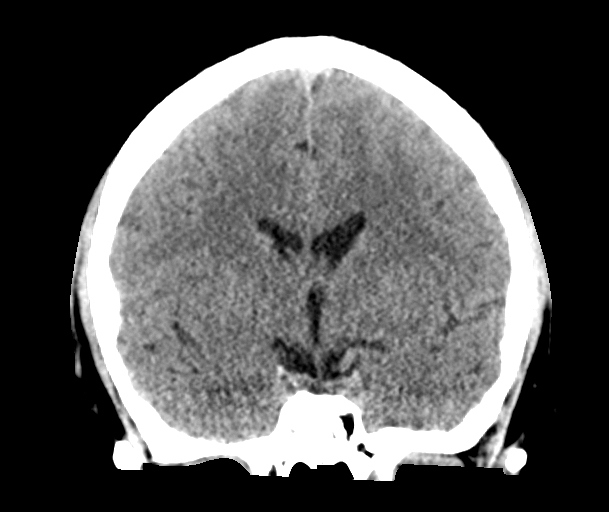

[Series 6: head 3.0 mpr sag · sagittal · 0.30mm/px · 3 of 67 slices shown]
[im 23/67  brain]
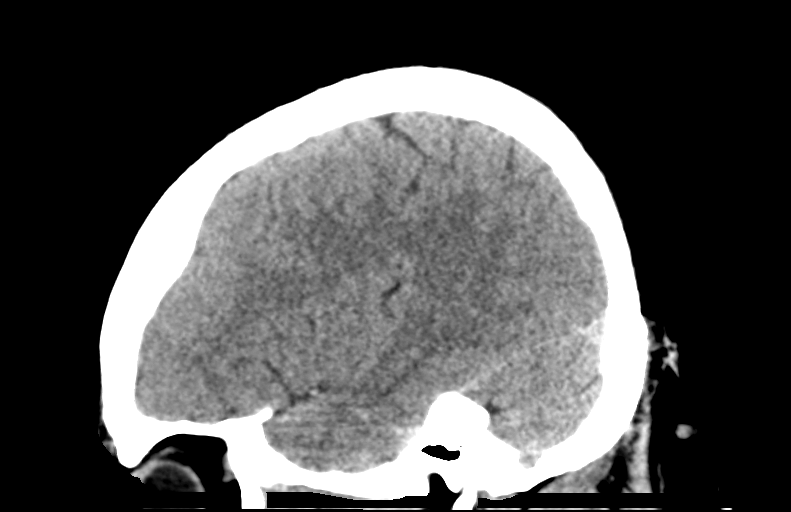
[im 34/67  brain]
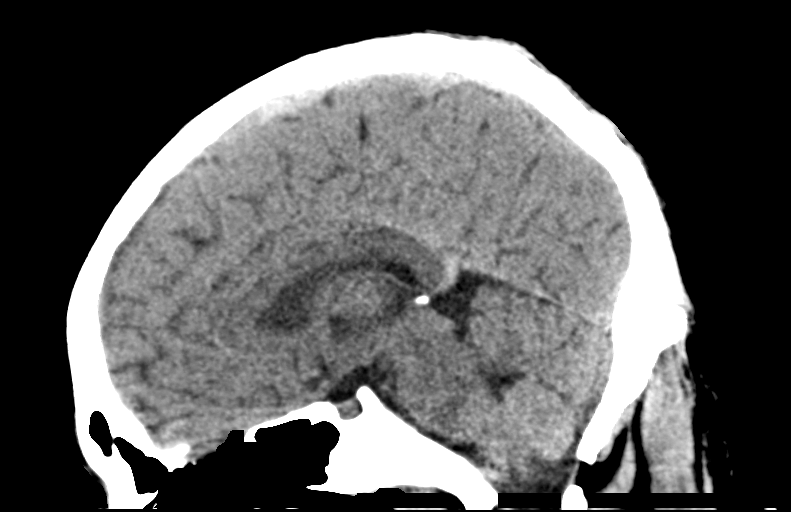
[im 45/67  brain]
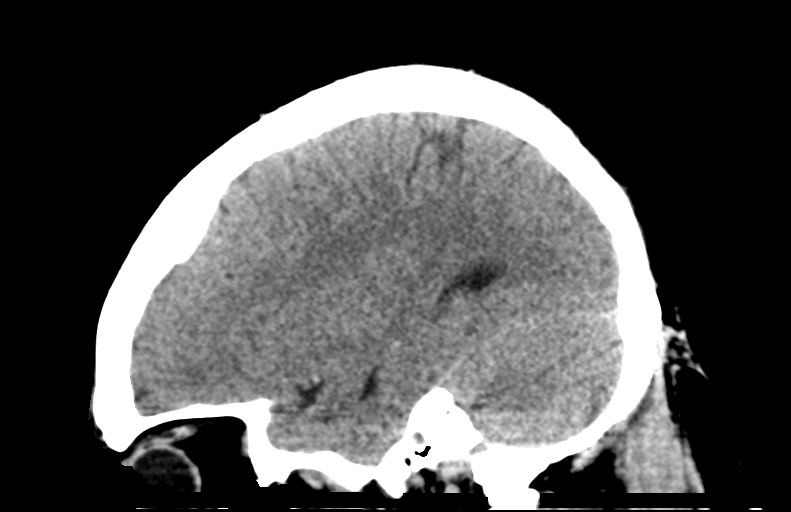

[14 of 47 positions shown; findings below may reference images not displayed]

FINDINGS: Brain: No intracranial hemorrhage, mass effect, or midline shift. No
hydrocephalus. The basilar cisterns are patent. No evidence of
territorial infarct or acute ischemia. No extra-axial or
intracranial fluid collection.

Vascular: No hyperdense vessel or unexpected calcification.

Skull: Normal. Negative for fracture or focal lesion.

Sinuses/Orbits: Paranasal sinuses and mastoid air cells are clear.
The visualized orbits are unremarkable.

Other: None.
IMPRESSION: Negative noncontrast head CT.

## 2022-09-03 ENCOUNTER — Encounter: Payer: Self-pay | Admitting: Internal Medicine

## 2022-09-03 ENCOUNTER — Ambulatory Visit (INDEPENDENT_AMBULATORY_CARE_PROVIDER_SITE_OTHER): Payer: BC Managed Care – PPO | Admitting: Internal Medicine

## 2022-09-03 VITALS — BP 124/80 | HR 84 | Temp 98.1°F | Ht 66.75 in | Wt 216.0 lb

## 2022-09-03 DIAGNOSIS — F4321 Adjustment disorder with depressed mood: Secondary | ICD-10-CM

## 2022-09-03 DIAGNOSIS — J4521 Mild intermittent asthma with (acute) exacerbation: Secondary | ICD-10-CM | POA: Diagnosis not present

## 2022-09-03 DIAGNOSIS — J45909 Unspecified asthma, uncomplicated: Secondary | ICD-10-CM | POA: Insufficient documentation

## 2022-09-03 MED ORDER — DOXYCYCLINE HYCLATE 100 MG PO TABS: 100.0000 mg | ORAL_TABLET | Freq: Two times a day (BID) | ORAL | 0 refills | Status: DC

## 2022-09-03 MED ORDER — PREDNISONE 20 MG PO TABS: 40.0000 mg | ORAL_TABLET | Freq: Every day | ORAL | 0 refills | Status: DC

## 2022-09-03 MED ORDER — FLUOXETINE HCL 20 MG PO CAPS: 20.0000 mg | ORAL_CAPSULE | Freq: Every day | ORAL | 3 refills | Status: DC

## 2022-09-03 NOTE — Assessment & Plan Note (Signed)
Fairly pervasive symptoms that are really affecting his life Hard for him to deal with kids--and their adjustment Feels ready to start something Will try fluoxetine 20 every other day for a week--then daily if tolerated Recheck 4-6 weeks

## 2022-09-03 NOTE — Assessment & Plan Note (Signed)
Will give prednisone 40 x 3, then 20 x 3 Doxy 100 bid x 7 days Can use son's albuterol nebs prn

## 2022-09-03 NOTE — Progress Notes (Signed)
Subjective:    Patient ID: Jesus Montes, male    DOB: 05-19-1986, 37 y.o.   MRN: [id number]  HPI Here due to a respiratory illness  Started 6 days ago Thinks he got it from his kids Started with cough and congestion--then fever that first night Cough worsened---appetite gone Lost 16# since last week Lots of drainage in throat Cough was worse at night---and having facial pressure (thought it could be his BP)  Thought he was improving--but then getting dizzy spells Took some time off work Intermittent fevers--ibuprofen helped. Now this is better Cough worse--into chest and with some bleeding Now with some blood tinged sputum  Used albuterol neb--son has machine This did help SOB only with cough/dry throat when lying back  He also notes ongoing anxiety---much related to grief after losing dad Did start with counselor--has helped some Still gets spells with anxiety acting up Happening at least 3 days per week Some sad feelings as well Went back to drinking alcohol---to self medicate anxiety and grief (not really now) No thoughts of suicide  Current Outpatient Medications on File Prior to Visit  Medication Sig Dispense Refill   lisinopril (ZESTRIL) 10 MG tablet Take 1 tablet (10 mg total) by mouth daily. 90 tablet 3   meclizine (ANTIVERT) 25 MG tablet Take 1 tablet (25 mg total) by mouth 3 (three) times daily as needed for dizziness. 30 tablet 0   ondansetron (ZOFRAN) 4 MG tablet Take 1 tablet (4 mg total) by mouth every 8 (eight) hours as needed for nausea or vomiting. 20 tablet 0   No current facility-administered medications on file prior to visit.    No Known Allergies  Past Medical History:  Diagnosis Date   Seizures ([place])    in elementary school due to blood sugar    Past Surgical History:  Procedure Laterality Date   TYMPANOPLASTY     VASECTOMY      Family History  Problem Relation Age of Onset   Asthma Mother    Heart attack Father 14   Asthma Son     Heart disease Paternal Uncle    Heart attack Paternal Uncle 43   Heart disease Maternal Grandmother        stent placement   Parkinson's disease Maternal Grandfather    Heart attack Paternal Grandfather 13   Heart attack Paternal Uncle 35   Heart attack Paternal Uncle    Heart attack Paternal Uncle    Heart attack Paternal Uncle     Social History   Socioeconomic History   Marital status: Married    Spouse name: Chelsey   Number of children: 4   Years of education: high school   Highest education level: Not on file  Occupational History   Not on file  Tobacco Use   Smoking status: Never   Smokeless tobacco: Never  Vaping Use   Vaping Use: Every day  Substance and Sexual Activity   Alcohol use: Yes    Comment: once every few months   Drug use: No   Sexual activity: Yes    Birth control/protection: Surgical  Other Topics Concern   Not on file  Social History Narrative   05/21/20   From: the area   Living: with wife, Chelsey (2018)   Work: Cabin crew      Family: 4 kids - Anda Kraft (2010), Kylie (2011) (partial custody), Keenan Bachelor (2013), and Jonelle Sidle (2018)       Enjoys: hunting, hiking, gaming w/ friends  Exercise: during hunting season - every other day is hunting - 4 miles + walking   Diet: not currently, not great - tries eat fruits/veggies but is a big meat eater      Safety   Seat belts: Yes    Guns: Yes  and secure   Safe in relationships: Yes    Social Determinants of Health   Financial Resource Strain: Not on file  Food Insecurity: Not on file  Transportation Needs: Not on file  Physical Activity: Not on file  Stress: Not on file  Social Connections: Not on file  Intimate Partner Violence: Not on file   Review of Systems No N/V Never diagnosed with asthma     Objective:   Physical Exam HENT:     Head:     Comments: No sinus tenderness    Right Ear: Tympanic membrane and ear canal normal.     Left Ear: Tympanic membrane and ear canal  normal.     Mouth/Throat:     Pharynx: No oropharyngeal exudate or posterior oropharyngeal erythema.  Pulmonary:     Effort: Pulmonary effort is normal.     Breath sounds: No rales.     Comments: Mildly decreased breath sounds with mild exp prolongation and exp wheezes Musculoskeletal:     Cervical back: Neck supple.  Lymphadenopathy:     Cervical: No cervical adenopathy.  Neurological:     Mental Status: He is alert.            Assessment & Plan:

## 2022-09-03 NOTE — Patient Instructions (Signed)
Please start the fluoxetine once you are feeling better with the infection. Then take it once every other day for 1-2 weeks--and if no problems, increase to daily

## 2022-09-25 ENCOUNTER — Telehealth: Payer: Self-pay | Admitting: Family

## 2022-09-25 NOTE — Telephone Encounter (Signed)
I do not see any recent referrals placed by Dr Alphonsus Sias for [facility], so this is not something that I can help with at this time.    Please advise Dr Alphonsus Sias, thanks.

## 2022-09-25 NOTE — Telephone Encounter (Signed)
   Reason for Referral Request:   Has patient been seen PCP for this complaint? Set for transfer of care appt on 5/28  No,  please schedule patient for appointment for complaint.  Patient scheduled on: 5/28  Yes, please find out following information.  Referral for which specialty: Behavioral Health (therapist)  Preferred office/provider no spec. Office, patient asked if it could be in [place] area

## 2022-09-28 NOTE — Telephone Encounter (Signed)
Noted. Will await patient's response.

## 2022-09-29 DIAGNOSIS — F429 Obsessive-compulsive disorder, unspecified: Secondary | ICD-10-CM | POA: Diagnosis not present

## 2022-09-29 DIAGNOSIS — F411 Generalized anxiety disorder: Secondary | ICD-10-CM | POA: Diagnosis not present

## 2022-09-29 DIAGNOSIS — F4310 Post-traumatic stress disorder, unspecified: Secondary | ICD-10-CM | POA: Diagnosis not present

## 2022-09-29 NOTE — Telephone Encounter (Signed)
Left message for pt to call office

## 2022-09-30 NOTE — Telephone Encounter (Signed)
Thank you :)

## 2022-09-30 NOTE — Telephone Encounter (Signed)
Patient called back in returning call. He stated that he no longer needs the referral, he found something in network with his insurance. Thank you!

## 2022-09-30 NOTE — Telephone Encounter (Signed)
Left 2nd message to call back

## 2022-10-02 DIAGNOSIS — F411 Generalized anxiety disorder: Secondary | ICD-10-CM | POA: Diagnosis not present

## 2022-10-02 DIAGNOSIS — F4310 Post-traumatic stress disorder, unspecified: Secondary | ICD-10-CM | POA: Diagnosis not present

## 2022-10-02 DIAGNOSIS — F429 Obsessive-compulsive disorder, unspecified: Secondary | ICD-10-CM | POA: Diagnosis not present

## 2022-10-08 DIAGNOSIS — F429 Obsessive-compulsive disorder, unspecified: Secondary | ICD-10-CM | POA: Diagnosis not present

## 2022-10-08 DIAGNOSIS — F411 Generalized anxiety disorder: Secondary | ICD-10-CM | POA: Diagnosis not present

## 2022-10-08 DIAGNOSIS — F4310 Post-traumatic stress disorder, unspecified: Secondary | ICD-10-CM | POA: Diagnosis not present

## 2022-10-15 ENCOUNTER — Ambulatory Visit: Payer: BC Managed Care – PPO | Admitting: Internal Medicine

## 2022-10-15 DIAGNOSIS — F429 Obsessive-compulsive disorder, unspecified: Secondary | ICD-10-CM | POA: Diagnosis not present

## 2022-10-15 DIAGNOSIS — F4310 Post-traumatic stress disorder, unspecified: Secondary | ICD-10-CM | POA: Diagnosis not present

## 2022-10-15 DIAGNOSIS — F411 Generalized anxiety disorder: Secondary | ICD-10-CM | POA: Diagnosis not present

## 2022-10-27 ENCOUNTER — Encounter: Payer: Self-pay | Admitting: Internal Medicine

## 2022-10-27 ENCOUNTER — Ambulatory Visit (INDEPENDENT_AMBULATORY_CARE_PROVIDER_SITE_OTHER): Payer: BC Managed Care – PPO | Admitting: Internal Medicine

## 2022-10-27 VITALS — BP 122/70 | HR 73 | Temp 98.1°F | Ht 66.75 in | Wt 219.0 lb

## 2022-10-27 DIAGNOSIS — G479 Sleep disorder, unspecified: Secondary | ICD-10-CM | POA: Diagnosis not present

## 2022-10-27 DIAGNOSIS — I10 Essential (primary) hypertension: Secondary | ICD-10-CM | POA: Diagnosis not present

## 2022-10-27 DIAGNOSIS — F419 Anxiety disorder, unspecified: Secondary | ICD-10-CM | POA: Diagnosis not present

## 2022-10-27 NOTE — Assessment & Plan Note (Signed)
Better now with counseling Will hold off on any other medications for now

## 2022-10-27 NOTE — Assessment & Plan Note (Signed)
BP Readings from Last 3 Encounters:  10/27/22 122/70  09/03/22 124/80  11/27/21 110/72   Controlled on lisinopril 10---did go up when he missed doses

## 2022-10-27 NOTE — Progress Notes (Signed)
Subjective:    Patient ID: Jesus Montes, male    DOB: December 22, 1985, 37 y.o.   MRN: [id number]  HPI Here for follow up of anxiety  The fluoxetine made him feel funny--"like I was unable to function" Felt "shut down" Has started therapy with [facility]" Feels he is working better through his dad's death Feels he is "breaking down a lot of walls" No recent anxiety since starting therapy  He is monitoring BP at home Has gone back to drinking some energy drinks---then felt heart fluttering and doesn't feel great BP went up when he missed a few days of the lisinopril  Current Outpatient Medications on File Prior to Visit  Medication Sig Dispense Refill   lisinopril (ZESTRIL) 10 MG tablet Take 1 tablet (10 mg total) by mouth daily. 90 tablet 3   meclizine (ANTIVERT) 25 MG tablet Take 1 tablet (25 mg total) by mouth 3 (three) times daily as needed for dizziness. 30 tablet 0   ondansetron (ZOFRAN) 4 MG tablet Take 1 tablet (4 mg total) by mouth every 8 (eight) hours as needed for nausea or vomiting. 20 tablet 0   No current facility-administered medications on file prior to visit.    No Known Allergies  Past Medical History:  Diagnosis Date   Seizures (HCC)    in elementary school due to blood sugar    Past Surgical History:  Procedure Laterality Date   TYMPANOPLASTY     VASECTOMY      Family History  Problem Relation Age of Onset   Asthma Mother    Heart attack Father 67   Stroke Father    Asthma Son    Heart disease Paternal Uncle    Heart attack Paternal Uncle 35   Heart attack Paternal Uncle 35   Heart attack Paternal Uncle    Heart attack Paternal Uncle    Heart attack Paternal Uncle    Heart disease Maternal Grandmother        stent placement   Parkinson's disease Maternal Grandfather    Heart attack Paternal Grandfather 69    Social History   Socioeconomic History   Marital status: Married    Spouse name: Freight forwarder   Number of children: 4   Years of  education: high school   Highest education level: Not on file  Occupational History   Not on file  Tobacco Use   Smoking status: Never   Smokeless tobacco: Never  Vaping Use   Vaping Use: Every day  Substance and Sexual Activity   Alcohol use: Yes    Comment: once every few months   Drug use: No   Sexual activity: Yes    Birth control/protection: Surgical  Other Topics Concern   Not on file  Social History Narrative   05/21/20   From: the area   Living: with wife, Chelsey (2018)   Work: Product manager      Family: 4 kids - Jae Dire (2010), Kylie (2011) (partial custody), Harrison Mons (2013), and Maurine Minister (2018)       Enjoys: hunting, hiking, gaming w/ friends      Exercise: during hunting season - every other day is hunting - 4 miles + walking   Diet: not currently, not great - 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   Seat belts: Yes    Guns: Yes  and secure   Safe in relationships: Yes    Social Determinants of Dispensing optician  Resource Strain: Not on file  Food Insecurity: Not on file  Transportation Needs: Not on file  Physical Activity: Not on file  Stress: Not on file  Social Connections: Not on file  Intimate Partner Violence: Not on file   Review of Systems Not sleeping great---4 kids and lots of responsibility Feels not restored---and daytime somnolence in afternoon (helped by moving around) Does snore--unsure about apnea Back in gym working out/running Does have more energy     Objective:   Physical Exam Constitutional:      Appearance: Normal appearance.  Neurological:     Mental Status: He is alert.  Psychiatric:        Mood and Affect: Mood normal.        Behavior: Behavior normal.            Assessment & Plan:

## 2022-10-27 NOTE — Assessment & Plan Note (Signed)
Will set up for sleep evaluation

## 2022-10-29 DIAGNOSIS — F429 Obsessive-compulsive disorder, unspecified: Secondary | ICD-10-CM | POA: Diagnosis not present

## 2022-10-29 DIAGNOSIS — F4310 Post-traumatic stress disorder, unspecified: Secondary | ICD-10-CM | POA: Diagnosis not present

## 2022-10-29 DIAGNOSIS — F411 Generalized anxiety disorder: Secondary | ICD-10-CM | POA: Diagnosis not present

## 2022-11-05 DIAGNOSIS — F411 Generalized anxiety disorder: Secondary | ICD-10-CM | POA: Diagnosis not present

## 2022-11-05 DIAGNOSIS — F429 Obsessive-compulsive disorder, unspecified: Secondary | ICD-10-CM | POA: Diagnosis not present

## 2022-11-05 DIAGNOSIS — F4310 Post-traumatic stress disorder, unspecified: Secondary | ICD-10-CM | POA: Diagnosis not present

## 2022-11-12 DIAGNOSIS — F411 Generalized anxiety disorder: Secondary | ICD-10-CM | POA: Diagnosis not present

## 2022-11-12 DIAGNOSIS — F4310 Post-traumatic stress disorder, unspecified: Secondary | ICD-10-CM | POA: Diagnosis not present

## 2022-11-12 DIAGNOSIS — F429 Obsessive-compulsive disorder, unspecified: Secondary | ICD-10-CM | POA: Diagnosis not present

## 2022-11-17 ENCOUNTER — Encounter: Payer: BC Managed Care – PPO | Admitting: Family

## 2022-11-19 DIAGNOSIS — F411 Generalized anxiety disorder: Secondary | ICD-10-CM | POA: Diagnosis not present

## 2022-11-19 DIAGNOSIS — F4310 Post-traumatic stress disorder, unspecified: Secondary | ICD-10-CM | POA: Diagnosis not present

## 2022-11-19 DIAGNOSIS — F429 Obsessive-compulsive disorder, unspecified: Secondary | ICD-10-CM | POA: Diagnosis not present

## 2022-11-26 DIAGNOSIS — F429 Obsessive-compulsive disorder, unspecified: Secondary | ICD-10-CM | POA: Diagnosis not present

## 2022-11-26 DIAGNOSIS — F4310 Post-traumatic stress disorder, unspecified: Secondary | ICD-10-CM | POA: Diagnosis not present

## 2022-11-26 DIAGNOSIS — F411 Generalized anxiety disorder: Secondary | ICD-10-CM | POA: Diagnosis not present

## 2022-12-08 ENCOUNTER — Encounter: Payer: Self-pay | Admitting: Primary Care

## 2022-12-08 ENCOUNTER — Ambulatory Visit (INDEPENDENT_AMBULATORY_CARE_PROVIDER_SITE_OTHER): Payer: BC Managed Care – PPO | Admitting: Primary Care

## 2022-12-08 VITALS — BP 118/74 | HR 75 | Temp 97.4°F | Ht 67.0 in | Wt 217.2 lb

## 2022-12-08 DIAGNOSIS — G4719 Other hypersomnia: Secondary | ICD-10-CM | POA: Diagnosis not present

## 2022-12-08 DIAGNOSIS — F4310 Post-traumatic stress disorder, unspecified: Secondary | ICD-10-CM | POA: Diagnosis not present

## 2022-12-08 DIAGNOSIS — R0683 Snoring: Secondary | ICD-10-CM | POA: Diagnosis not present

## 2022-12-08 DIAGNOSIS — F429 Obsessive-compulsive disorder, unspecified: Secondary | ICD-10-CM | POA: Diagnosis not present

## 2022-12-08 DIAGNOSIS — F411 Generalized anxiety disorder: Secondary | ICD-10-CM | POA: Diagnosis not present

## 2022-12-08 NOTE — Assessment & Plan Note (Addendum)
-   Patient has symptoms of loud snoring, waking up gasping for air, non-restorative sleep and excessive daytime sleepiness.  Epworth 17.  Concern patient has underlying obstructive sleep apnea, needs home sleep study evaluate.  We discussed risks of untreated sleep apnea including cardiac rhythms, pulmonary hypertension, diabetes and stroke.  We also reviewed treatment options including weight loss, oral appliance, CPAP therapy referral to ENT for possible surgical options. Encourage side sleeping position. Advised against driving if experiencing excessive daytime sleepiness. FU 1-2 weeks after completing sleep study to review results and treatment options.

## 2022-12-08 NOTE — Progress Notes (Signed)
Reviewed and agree with assessment/plan.   Alexcia Schools, MD [facility] 12/08/2022, 5:03 PM Pager:  [phone]  

## 2022-12-08 NOTE — Progress Notes (Signed)
@Patient  ID: Jesus Montes, male    DOB: 1986/05/07, 37 y.o.   MRN: [id number]  Chief Complaint  Patient presents with   Consult    Epworth 17    Referring provider: Karie Schwalbe, MD  HPI: 37 year old male, never smoked.  Past medical history significant for hypertension, asthmatic bronchitis, anxiety, sleep disorder, obesity.  12/08/2022 Presents today for sleep consult. He has symptoms of daytime sleepiness/fatigue. He has been told by his wife that he snores loudly. He has woken himself up before gasping for air. Sleep at times can be restless. He wakes up with in the middle of the night with dry mouth. He is getting between 6-7 hours of sleep a night but does not feel rested. Typical bedtime between 9-11pm on work days. Takes him 5-10 minutes to fall asleep. He wakes up 1-2 times a night to use the restroom. He starts his day at 6-6:30am. He does not operate heavy machinery. Weight has fluctuated. His father past away last August, he was smoking marijuana for a couple of months after this time which helped him to sleep. He does not currently take anything for sleep. No concern for narcolepsy, cataplexy or sleep walking.   Sleep questionnaire Symptoms-   waking up tired, constantly tired Prior sleep study- none  Bedtime- 9-11pm Time to fall asleep- 5-53min Nocturnal awakenings- 1-2 times  Out of bed/start of day- 6am-6:30am Weight changes- 15-20lbs  Do you operate heavy machinery- no Do you currently wear CPAP- no Do you current wear oxygen- no Epworth- 17  No Known Allergies   There is no immunization history on file for this patient.  Past Medical History:  Diagnosis Date   Seizures (HCC)    in elementary school due to blood sugar   Vertigo     Tobacco History: Social History   Tobacco Use  Smoking Status Never  Smokeless Tobacco Never   Counseling given: Not Answered   Outpatient Medications Prior to Visit  Medication Sig Dispense Refill    lisinopril (ZESTRIL) 10 MG tablet Take 1 tablet (10 mg total) by mouth daily. 90 tablet 3   meclizine (ANTIVERT) 25 MG tablet Take 1 tablet (25 mg total) by mouth 3 (three) times daily as needed for dizziness. 30 tablet 0   ondansetron (ZOFRAN) 4 MG tablet Take 1 tablet (4 mg total) by mouth every 8 (eight) hours as needed for nausea or vomiting. 20 tablet 0   No facility-administered medications prior to visit.   Review of Systems  Review of Systems  Constitutional: Negative.   Respiratory: Negative.     Physical Exam  BP 118/74 (BP Location: Left Arm, Patient Position: Sitting, Cuff Size: Normal)   Pulse 75   Temp (!) 97.4 F (36.3 C) (Temporal)   Ht 5\' 7"  (1.702 m)   Wt 217 lb 3.2 oz (98.5 kg)   SpO2 97%   BMI 34.02 kg/m  Physical Exam Constitutional:      Appearance: Normal appearance. He is not ill-appearing.  HENT:     Head: Normocephalic and atraumatic.     Mouth/Throat:     Mouth: Mucous membranes are moist.     Pharynx: Oropharynx is clear.     Comments: Mallampati class II Cardiovascular:     Rate and Rhythm: Normal rate and regular rhythm.  Pulmonary:     Effort: Pulmonary effort is normal.     Breath sounds: Normal breath sounds.  Neurological:     General: No focal deficit present.  Mental Status: He is alert and oriented to person, place, and time. Mental status is at baseline.  Psychiatric:        Mood and Affect: Mood normal.        Behavior: Behavior normal.        Thought Content: Thought content normal.        Judgment: Judgment normal.      Lab Results:  CBC    Component Value Date/Time   WBC 7.9 08/25/2021 1136   RBC 5.24 08/25/2021 1136   HGB 16.4 08/25/2021 1136   HCT 48.5 08/25/2021 1136   PLT 231.0 08/25/2021 1136   MCV 92.6 08/25/2021 1136   MCH 32.1 08/30/2020 1650   MCHC 33.9 08/25/2021 1136   RDW 13.2 08/25/2021 1136   LYMPHSABS 2.5 08/25/2021 1136   MONOABS 0.7 08/25/2021 1136   EOSABS 0.1 08/25/2021 1136   BASOSABS 0.1  08/25/2021 1136    BMET    Component Value Date/Time   NA 138 08/25/2021 1136   K 4.4 08/25/2021 1136   CL 101 08/25/2021 1136   CO2 29 08/25/2021 1136   GLUCOSE 79 08/25/2021 1136   BUN 9 08/25/2021 1136   CREATININE 0.99 08/25/2021 1136   CALCIUM 9.8 08/25/2021 1136   GFRNONAA >60 08/30/2020 1650    BNP No results found for: "BNP"  ProBNP No results found for: "PROBNP"  Imaging: No results found.   Assessment & Plan:   Loud snoring - Patient has symptoms of loud snoring, waking up gasping for air, non-restorative sleep and excessive daytime sleepiness.  Epworth 17.  Concern patient has underlying obstructive sleep apnea, needs home sleep study evaluate.  We discussed risks of untreated sleep apnea including cardiac rhythms, pulmonary hypertension, diabetes and stroke.  We also reviewed treatment options including weight loss, oral appliance, CPAP therapy referral to ENT for possible surgical options. Encourage side sleeping position. Advised against driving if experiencing excessive daytime sleepiness. FU 1-2 weeks after completing sleep study to review results and treatment options.    Glenford Bayley, NP 12/08/2022

## 2022-12-08 NOTE — Patient Instructions (Addendum)
Sleep apnea is defined as period of 10 seconds or longer when you stop breathing at night. This can happen multiple times a night. Dx sleep apnea is when this occurs more than 5 times an hour.    Mild OSA 5-15 apneic events an hour Moderate OSA 15-30 apneic events an hour Severe OSA > 30 apneic events an hour   Untreated sleep apnea puts you at higher risk for cardiac arrhythmias, pulmonary HTN, stroke and diabetes  Treatment options include weight loss, side sleeping position, oral appliance, CPAP therapy or referral to ENT for possible surgical options    Recommendations: Focus on side sleeping position or elevate head with wedge pillow 30 degrees Work on weight loss efforts if able  Do not drive if experiencing excessive daytime sleepiness of fatigue    Orders: Home sleep study re: loud snoring    Follow-up: Please call 1-2 weeks after completing home sleep study to review results and treatment if needed    Sleep Apnea Sleep apnea is a condition in which breathing pauses or becomes shallow during sleep. People with sleep apnea usually snore loudly. They may have times when they gasp and stop breathing for 10 seconds or more during sleep. This may happen many times during the night. Sleep apnea disrupts your sleep and keeps your body from getting the rest that it needs. This condition can increase your risk of certain health problems, including: Heart attack. Stroke. Obesity. Type 2 diabetes. Heart failure. Irregular heartbeat. High blood pressure. The goal of treatment is to help you breathe normally again. What are the causes?  The most common cause of sleep apnea is a collapsed or blocked airway. There are three kinds of sleep apnea: Obstructive sleep apnea. This kind is caused by a blocked or collapsed airway. Central sleep apnea. This kind happens when the part of the brain that controls breathing does not send the correct signals to the muscles that control  breathing. Mixed sleep apnea. This is a combination of obstructive and central sleep apnea. What increases the risk? You are more likely to develop this condition if you: Are overweight. Smoke. Have a smaller than normal airway. Are older. Are male. Drink alcohol. Take sedatives or tranquilizers. Have a family history of sleep apnea. Have a tongue or tonsils that are larger than normal. What are the signs or symptoms? Symptoms of this condition include: Trouble staying asleep. Loud snoring. Morning headaches. Waking up gasping. Dry mouth or sore throat in the morning. Daytime sleepiness and tiredness. If you have daytime fatigue because of sleep apnea, you may be more likely to have: Trouble concentrating. Forgetfulness. Irritability or mood swings. Personality changes. Feelings of depression. Sexual dysfunction. This may include loss of interest if you are male, or erectile dysfunction if you are male. How is this diagnosed? This condition may be diagnosed with: A medical history. A physical exam. A series of tests that are done while you are sleeping (sleep study). These tests are usually done in a sleep lab, but they may also be done at home. How is this treated? Treatment for this condition aims to restore normal breathing and to ease symptoms during sleep. It may involve managing health issues that can affect breathing, such as high blood pressure or obesity. Treatment may include: Sleeping on your side. Using a decongestant if you have nasal congestion. Avoiding the use of depressants, including alcohol, sedatives, and narcotics. Losing weight if you are overweight. Making changes to your diet. Quitting smoking. Using a  device to open your airway while you sleep, such as: An oral appliance. This is a custom-made mouthpiece that shifts your lower jaw forward. A continuous positive airway pressure (CPAP) device. This device blows air through a mask when you breathe  out (exhale). A nasal expiratory positive airway pressure (EPAP) device. This device has valves that you put into each nostril. A bi-level positive airway pressure (BIPAP) device. This device blows air through a mask when you breathe in (inhale) and breathe out (exhale). Having surgery if other treatments do not work. During surgery, excess tissue is removed to create a wider airway. Follow these instructions at home: Lifestyle Make any lifestyle changes that your health care provider recommends. Eat a healthy, well-balanced diet. Take steps to lose weight if you are overweight. Avoid using depressants, including alcohol, sedatives, and narcotics. Do not use any products that contain nicotine or tobacco. These products include cigarettes, chewing tobacco, and vaping devices, such as e-cigarettes. If you need help quitting, ask your health care provider. General instructions Take over-the-counter and prescription medicines only as told by your health care provider. If you were given a device to open your airway while you sleep, use it only as told by your health care provider. If you are having surgery, make sure to tell your health care provider you have sleep apnea. You may need to bring your device with you. Keep all follow-up visits. This is important. Contact a health care provider if: The device that you received to open your airway during sleep is uncomfortable or does not seem to be working. Your symptoms do not improve. Your symptoms get worse. Get help right away if: You develop: Chest pain. Shortness of breath. Discomfort in your back, arms, or stomach. You have: Trouble speaking. Weakness on one side of your body. Drooping in your face. These symptoms may represent a serious problem that is an emergency. Do not wait to see if the symptoms will go away. Get medical help right away. Call your local emergency services (911 in the [place] Do not drive yourself to the  hospital. Summary Sleep apnea is a condition in which breathing pauses or becomes shallow during sleep. The most common cause is a collapsed or blocked airway. The goal of treatment is to restore normal breathing and to ease symptoms during sleep. This information is not intended to replace advice given to you by your health care provider. Make sure you discuss any questions you have with your health care provider. Document Revised: 01/15/2021 Document Reviewed: 05/17/2020 Elsevier Patient Education  2024 [employer].  Elsevier Patient Education  2023 [employer].

## 2022-12-20 DIAGNOSIS — S62327A Displaced fracture of shaft of fifth metacarpal bone, left hand, initial encounter for closed fracture: Secondary | ICD-10-CM | POA: Diagnosis not present

## 2022-12-21 DIAGNOSIS — M25642 Stiffness of left hand, not elsewhere classified: Secondary | ICD-10-CM | POA: Diagnosis not present

## 2022-12-21 DIAGNOSIS — S62327A Displaced fracture of shaft of fifth metacarpal bone, left hand, initial encounter for closed fracture: Secondary | ICD-10-CM | POA: Diagnosis not present

## 2022-12-22 DIAGNOSIS — F411 Generalized anxiety disorder: Secondary | ICD-10-CM | POA: Diagnosis not present

## 2022-12-22 DIAGNOSIS — F429 Obsessive-compulsive disorder, unspecified: Secondary | ICD-10-CM | POA: Diagnosis not present

## 2022-12-22 DIAGNOSIS — F4310 Post-traumatic stress disorder, unspecified: Secondary | ICD-10-CM | POA: Diagnosis not present

## 2022-12-28 DIAGNOSIS — S62327A Displaced fracture of shaft of fifth metacarpal bone, left hand, initial encounter for closed fracture: Secondary | ICD-10-CM | POA: Diagnosis not present

## 2023-01-05 DIAGNOSIS — F4310 Post-traumatic stress disorder, unspecified: Secondary | ICD-10-CM | POA: Diagnosis not present

## 2023-01-05 DIAGNOSIS — F429 Obsessive-compulsive disorder, unspecified: Secondary | ICD-10-CM | POA: Diagnosis not present

## 2023-01-05 DIAGNOSIS — F411 Generalized anxiety disorder: Secondary | ICD-10-CM | POA: Diagnosis not present

## 2023-01-08 ENCOUNTER — Ambulatory Visit (INDEPENDENT_AMBULATORY_CARE_PROVIDER_SITE_OTHER): Payer: BC Managed Care – PPO | Admitting: Primary Care

## 2023-01-08 DIAGNOSIS — G4719 Other hypersomnia: Secondary | ICD-10-CM

## 2023-01-08 DIAGNOSIS — G4733 Obstructive sleep apnea (adult) (pediatric): Secondary | ICD-10-CM

## 2023-01-08 DIAGNOSIS — R0683 Snoring: Secondary | ICD-10-CM

## 2023-01-13 DIAGNOSIS — F411 Generalized anxiety disorder: Secondary | ICD-10-CM | POA: Diagnosis not present

## 2023-01-13 DIAGNOSIS — F429 Obsessive-compulsive disorder, unspecified: Secondary | ICD-10-CM | POA: Diagnosis not present

## 2023-01-13 DIAGNOSIS — F4310 Post-traumatic stress disorder, unspecified: Secondary | ICD-10-CM | POA: Diagnosis not present

## 2023-01-19 DIAGNOSIS — F429 Obsessive-compulsive disorder, unspecified: Secondary | ICD-10-CM | POA: Diagnosis not present

## 2023-01-19 DIAGNOSIS — F411 Generalized anxiety disorder: Secondary | ICD-10-CM | POA: Diagnosis not present

## 2023-01-19 DIAGNOSIS — F4310 Post-traumatic stress disorder, unspecified: Secondary | ICD-10-CM | POA: Diagnosis not present

## 2023-01-19 NOTE — Progress Notes (Signed)
Please let patient know sleep study showed mild obstructive sleep apnea, average AHI 13/hour. SpO2 low 91% RA.  Schedule virtual visit with me to discuss results and treatment options

## 2023-01-20 ENCOUNTER — Encounter (INDEPENDENT_AMBULATORY_CARE_PROVIDER_SITE_OTHER): Payer: Self-pay

## 2023-01-26 DIAGNOSIS — F429 Obsessive-compulsive disorder, unspecified: Secondary | ICD-10-CM | POA: Diagnosis not present

## 2023-01-26 DIAGNOSIS — F4310 Post-traumatic stress disorder, unspecified: Secondary | ICD-10-CM | POA: Diagnosis not present

## 2023-01-26 DIAGNOSIS — F411 Generalized anxiety disorder: Secondary | ICD-10-CM | POA: Diagnosis not present

## 2023-02-09 ENCOUNTER — Encounter: Payer: BC Managed Care – PPO | Admitting: Family

## 2023-02-10 ENCOUNTER — Encounter: Payer: Self-pay | Admitting: Family Medicine

## 2023-03-03 ENCOUNTER — Encounter (HOSPITAL_BASED_OUTPATIENT_CLINIC_OR_DEPARTMENT_OTHER): Payer: Self-pay

## 2023-03-03 ENCOUNTER — Other Ambulatory Visit: Payer: Self-pay

## 2023-03-03 DIAGNOSIS — Z79899 Other long term (current) drug therapy: Secondary | ICD-10-CM | POA: Insufficient documentation

## 2023-03-03 DIAGNOSIS — R42 Dizziness and giddiness: Secondary | ICD-10-CM | POA: Diagnosis not present

## 2023-03-03 DIAGNOSIS — I10 Essential (primary) hypertension: Secondary | ICD-10-CM | POA: Diagnosis not present

## 2023-03-03 LAB — CBC
HCT: 45.3 % (ref 39.0–52.0)
Hemoglobin: 16.1 g/dL (ref 13.0–17.0)
MCH: 31.1 pg (ref 26.0–34.0)
MCHC: 35.5 g/dL (ref 30.0–36.0)
MCV: 87.6 fL (ref 80.0–100.0)
Platelets: 226 10*3/uL (ref 150–400)
RBC: 5.17 MIL/uL (ref 4.22–5.81)
RDW: 12.2 % (ref 11.5–15.5)
WBC: 11.4 10*3/uL — ABNORMAL HIGH (ref 4.0–10.5)
nRBC: 0 % (ref 0.0–0.2)

## 2023-03-03 LAB — BASIC METABOLIC PANEL
Anion gap: 12 (ref 5–15)
BUN: 13 mg/dL (ref 6–20)
CO2: 22 mmol/L (ref 22–32)
Calcium: 8.8 mg/dL — ABNORMAL LOW (ref 8.9–10.3)
Chloride: 104 mmol/L (ref 98–111)
Creatinine, Ser: 1.02 mg/dL (ref 0.61–1.24)
GFR, Estimated: >60 mL/min (ref >60)
Glucose, Bld: 160 mg/dL — ABNORMAL HIGH (ref 70–99)
Potassium: 3.5 mmol/L (ref 3.5–5.1)
Sodium: 138 mmol/L (ref 135–145)

## 2023-03-03 LAB — CBG MONITORING, ED: Glucose-Capillary: 145 mg/dL — ABNORMAL HIGH (ref 70–99)

## 2023-03-03 MED ORDER — MECLIZINE HCL 25 MG PO TABS
50.0000 mg | ORAL_TABLET | Freq: Once | ORAL | Status: AC
Start: 2023-03-03 — End: 2023-03-03
  Administered 2023-03-03: 50 mg via ORAL
  Filled 2023-03-03: qty 2

## 2023-03-03 NOTE — ED Triage Notes (Signed)
Pt states he is experiencing vertigo, hx of same, last episode >2 years ago, states his meclizine was expired. +nausea, vomiting

## 2023-03-04 ENCOUNTER — Emergency Department (HOSPITAL_BASED_OUTPATIENT_CLINIC_OR_DEPARTMENT_OTHER)
Admission: EM | Admit: 2023-03-04 | Discharge: 2023-03-04 | Disposition: A | Discharge status: Home / Self Care | Payer: BC Managed Care – PPO | Attending: Emergency Medicine | Admitting: Emergency Medicine

## 2023-03-04 ENCOUNTER — Telehealth: Payer: Self-pay

## 2023-03-04 DIAGNOSIS — R42 Dizziness and giddiness: Secondary | ICD-10-CM

## 2023-03-04 MED ORDER — MECLIZINE HCL 25 MG PO TABS: 25.0000 mg | ORAL_TABLET | Freq: Three times a day (TID) | ORAL | 0 refills | Status: AC | PRN

## 2023-03-04 MED ORDER — DIAZEPAM 5 MG/ML IJ SOLN
5.0000 mg | Freq: Once | INTRAMUSCULAR | Status: AC
  Administered 2023-03-04: 5 mg via INTRAVENOUS
  Filled 2023-03-04: qty 2

## 2023-03-04 MED ORDER — ONDANSETRON HCL 4 MG/2ML IJ SOLN
4.0000 mg | Freq: Once | INTRAMUSCULAR | Status: AC
  Administered 2023-03-04: 4 mg via INTRAVENOUS
  Filled 2023-03-04: qty 2

## 2023-03-04 MED ORDER — SODIUM CHLORIDE 0.9 % IV BOLUS
1000.0000 mL | Freq: Once | INTRAVENOUS | Status: AC
  Administered 2023-03-04: 1000 mL via INTRAVENOUS

## 2023-03-04 NOTE — ED Notes (Addendum)
RN reviewed discharge instructions with pt. Pt verbalized understanding and had no further questions

## 2023-03-04 NOTE — Discharge Instructions (Signed)
Begin taking meclizine as prescribed as needed for dizziness.  Drink plenty of fluids and get plenty of rest.  Return to the ER if your symptoms significantly worsen or change.

## 2023-03-04 NOTE — ED Notes (Signed)
Dizziness and nausea improved, pt ambulatory without assistance upon discharge

## 2023-03-04 NOTE — ED Provider Notes (Signed)
[place] EMERGENCY DEPARTMENT AT [facility] Provider Note   CSN: [id number] Arrival date & time: 03/03/23  2129     History  Chief Complaint  Patient presents with   Dizziness    Jesus Montes is a 37 y.o. male.  Patient is a 37 year old male with past medical history of hypertension, vertigo.  Patient presenting today with complaints of dizziness.  This started earlier this morning.  He took a dose of meclizine with little relief, so comes here to be evaluated.  He denies any headache, visual disturbances, weakness, or numbness.  He describes this dizziness as a spinning sensation that is worse when he turns his head or changes positions.  The history is provided by the patient.       Home Medications Prior to Admission medications   Medication Sig Start Date End Date Taking? Authorizing Provider  lisinopril (ZESTRIL) 10 MG tablet Take 1 tablet (10 mg total) by mouth daily. 11/27/21   Gweneth Dimitri, MD  meclizine (ANTIVERT) 25 MG tablet Take 1 tablet (25 mg total) by mouth 3 (three) times daily as needed for dizziness. 10/23/20   Gweneth Dimitri, MD  ondansetron (ZOFRAN) 4 MG tablet Take 1 tablet (4 mg total) by mouth every 8 (eight) hours as needed for nausea or vomiting. 10/23/20   Gweneth Dimitri, MD      Allergies    Patient has no known allergies.    Review of Systems   Review of Systems  All other systems reviewed and are negative.   Physical Exam Updated Vital Signs BP 137/85   Pulse 92   Temp 97.7 F (36.5 C)   Resp 18   Ht 5\' 7"  (1.702 m)   Wt 99.8 kg   SpO2 97%   BMI 34.46 kg/m  Physical Exam Vitals and nursing note reviewed.  Constitutional:      General: He is not in acute distress.    Appearance: He is well-developed. He is not diaphoretic.  HENT:     Head: Normocephalic and atraumatic.  Eyes:     Extraocular Movements: Extraocular movements intact.     Pupils: Pupils are equal, round, and reactive to light.  Cardiovascular:     Rate  and Rhythm: Normal rate and regular rhythm.     Heart sounds: No murmur heard.    No friction rub.  Pulmonary:     Effort: Pulmonary effort is normal. No respiratory distress.     Breath sounds: Normal breath sounds. No wheezing or rales.  Abdominal:     General: Bowel sounds are normal. There is no distension.     Palpations: Abdomen is soft.     Tenderness: There is no abdominal tenderness.  Musculoskeletal:        General: Normal range of motion.     Cervical back: Normal range of motion and neck supple.  Skin:    General: Skin is warm and dry.  Neurological:     General: No focal deficit present.     Mental Status: He is alert and oriented to person, place, and time.     Cranial Nerves: No cranial nerve deficit.     Motor: No weakness.     Coordination: Coordination normal.     ED Results / Procedures / Treatments   Labs (all labs ordered are listed, but only abnormal results are displayed) Labs Reviewed  BASIC METABOLIC PANEL - Abnormal; Notable for the following components:      Result Value   Glucose,  Bld 160 (*)    Calcium 8.8 (*)    All other components within normal limits  CBC - Abnormal; Notable for the following components:   WBC 11.4 (*)    All other components within normal limits  CBG MONITORING, ED - Abnormal; Notable for the following components:   Glucose-Capillary 145 (*)    All other components within normal limits    EKG None  Radiology No results found.  Procedures Procedures    Medications Ordered in ED Medications  sodium chloride 0.9 % bolus 1,000 mL (has no administration in time range)  ondansetron (ZOFRAN) injection 4 mg (has no administration in time range)  diazepam (VALIUM) injection 5 mg (has no administration in time range)  meclizine (ANTIVERT) tablet 50 mg (50 mg Oral Given 03/03/23 2153)    ED Course/ Medical Decision Making/ A&P  Patient is a 37 year old male presenting with complaints of dizziness as described in the  HPI.  Patient arrives here with stable vital signs and physical examination which is basically unremarkable.  His symptoms do worsen with change in position and turning his head.  Workup initiated including CBC and basic metabolic panel, both of which are unremarkable.  Patient has received IV fluids along with Zofran and Valium for his dizziness.  He seems to be feeling significantly improved and is resting comfortably.  I highly suspect a peripheral vertigo.  Patient to be discharged with meclizine and as needed return.  Final Clinical Impression(s) / ED Diagnoses Final diagnoses:  None    Rx / DC Orders ED Discharge Orders     None         Geoffery Lyons, MD 03/04/23 [phone]

## 2023-03-04 NOTE — Telephone Encounter (Addendum)
Per chart review tab pt seen at [facility] ED on 03/03/23. Sending note to Hayden Pedro FNP. Pt has CPX scheduled with Hayden Pedro FNP on 04/29/23. From appt notes pt had no show on 02/09/23 for TOC.

## 2023-03-04 NOTE — Telephone Encounter (Signed)
Can we please change 04/29/23 patient visit to [facility] so that it accounts for a TOC/NP slot? Pt no showed my last TOC and has CPE on schedule that will actually be a TOC. Thanks

## 2023-03-05 NOTE — Telephone Encounter (Signed)
Changes have been updated

## 2023-04-05 ENCOUNTER — Telehealth: Payer: Self-pay
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
## 2023-04-29 ENCOUNTER — Ambulatory Visit: Payer: BC Managed Care – PPO | Admitting: Family

## 2023-04-29 ENCOUNTER — Encounter: Payer: Self-pay | Admitting: Family

## 2023-04-29 VITALS — BP 122/86 | HR 95 | Temp 97.8°F | Ht 67.0 in | Wt 221.2 lb

## 2023-04-29 DIAGNOSIS — R739 Hyperglycemia, unspecified: Secondary | ICD-10-CM | POA: Diagnosis not present

## 2023-04-29 DIAGNOSIS — Z720 Tobacco use: Secondary | ICD-10-CM

## 2023-04-29 DIAGNOSIS — E782 Mixed hyperlipidemia: Secondary | ICD-10-CM

## 2023-04-29 DIAGNOSIS — D72829 Elevated white blood cell count, unspecified: Secondary | ICD-10-CM | POA: Diagnosis not present

## 2023-04-29 DIAGNOSIS — Z8249 Family history of ischemic heart disease and other diseases of the circulatory system: Secondary | ICD-10-CM

## 2023-04-29 DIAGNOSIS — I10 Essential (primary) hypertension: Secondary | ICD-10-CM

## 2023-04-29 DIAGNOSIS — G4733 Obstructive sleep apnea (adult) (pediatric): Secondary | ICD-10-CM

## 2023-04-29 DIAGNOSIS — H6121 Impacted cerumen, right ear: Secondary | ICD-10-CM | POA: Diagnosis not present

## 2023-04-29 DIAGNOSIS — R42 Dizziness and giddiness: Secondary | ICD-10-CM

## 2023-04-29 DIAGNOSIS — H8113 Benign paroxysmal vertigo, bilateral: Secondary | ICD-10-CM

## 2023-04-29 LAB — CBC
HCT: 50.5 % (ref 39.0–52.0)
Hemoglobin: 16.9 g/dL (ref 13.0–17.0)
MCHC: 33.5 g/dL (ref 30.0–36.0)
MCV: 93.4 fL (ref 78.0–100.0)
Platelets: 236 10*3/uL (ref 150.0–400.0)
RBC: 5.41 Mil/uL (ref 4.22–5.81)
RDW: 13.3 % (ref 11.5–15.5)
WBC: 7.8 10*3/uL (ref 4.0–10.5)

## 2023-04-29 LAB — COMPREHENSIVE METABOLIC PANEL
ALT: 38 U/L (ref 0–53)
AST: 22 U/L (ref 0–37)
Albumin: 4.7 g/dL (ref 3.5–5.2)
Alkaline Phosphatase: 58 U/L (ref 39–117)
BUN: 10 mg/dL (ref 6–23)
CO2: 27 meq/L (ref 19–32)
Calcium: 9.3 mg/dL (ref 8.4–10.5)
Chloride: 103 meq/L (ref 96–112)
Creatinine, Ser: 0.95 mg/dL (ref 0.40–1.50)
GFR: 103.97 mL/min (ref >60.00)
Glucose, Bld: 81 mg/dL (ref 70–99)
Potassium: 4.4 meq/L (ref 3.5–5.1)
Sodium: 138 meq/L (ref 135–145)
Total Bilirubin: 0.6 mg/dL (ref 0.2–1.2)
Total Protein: 7.1 g/dL (ref 6.0–8.3)

## 2023-04-29 LAB — LIPID PANEL
Cholesterol: 150 mg/dL (ref 0–200)
HDL: 37 mg/dL — ABNORMAL LOW (ref >39.00)
LDL Cholesterol: 88 mg/dL (ref 0–99)
NonHDL: 113.01
Total CHOL/HDL Ratio: 4
Triglycerides: 124 mg/dL (ref 0.0–149.0)
VLDL: 24.8 mg/dL (ref 0.0–40.0)

## 2023-04-29 LAB — VITAMIN B12: Vitamin B-12: 309 pg/mL (ref 211–911)

## 2023-04-29 LAB — HEMOGLOBIN A1C: Hgb A1c MFr Bld: 5.4 % (ref 4.6–6.5)

## 2023-04-29 MED ORDER — LISINOPRIL 10 MG PO TABS: 10.0000 mg | ORAL_TABLET | Freq: Every day | ORAL | 3 refills | Status: DC

## 2023-04-29 MED ORDER — ONDANSETRON HCL 4 MG PO TABS: 4.0000 mg | ORAL_TABLET | Freq: Three times a day (TID) | ORAL | 0 refills | Status: AC | PRN

## 2023-04-29 NOTE — Patient Instructions (Addendum)
  A referral was placed today for cardiology.  Please let us know if you have not heard back within 2 weeks about the referral.  Restart lisinopril 10 mg once daily.  ------------------------------------  Start monitoring your blood pressure daily, around the same time of day, for the next 2-3 weeks.  Ensure that you have rested for 30 minutes prior to checking your blood pressure. Record your readings and bring them to your next visit.  ------------------------------------

## 2023-04-29 NOTE — Assessment & Plan Note (Signed)
Pt to call pulmonary for f/u to discuss possible cpap

## 2023-04-29 NOTE — Progress Notes (Signed)
Established Patient Office Visit  Subjective:  Patient ID: Jesus Montes, male    DOB: 12-05-1985  Age: 37 y.o. MRN: [id number]  CC:  Chief Complaint  Patient presents with   Establish Care    HPI Jesus Montes is here for a transition of care visit.  Prior provider was: Dr. Gweneth Dimitri   HTN: weaned himself off of lisinopril but he has noticed more often around 130/80 or so. He is checking it about once a week. No chest pain, palpitations and or sob. He states he will feel as though his blood pressure is high and check it about once a week with the elevation.   Pt is with acute concerns.  In the last few weeks with discomfort in his stomach. He states he drank kombucha but he has noticed since then and possibly prior to taking he has some sharp pains in his digestive tract. He tried to see if there were any trigger foods, but was unable to pinpoint anything specifically. He has not had the sharp pains in the last three days, but prior to that was almost daily. No blood noted in the stool. No N/V He does have hemorrhoids that will flare with certain foods but has been ok. U/s 12/22/21 with fatty liver otherwise unremarkable.   He does say he has had IBS symptoms, and he has changed his diet increased fiber and has had improvement however with less frequent bowel movements, but still going daily. Prior used to go five times a day.   Mild OSA: home sleep test 7/19 verified. Pt has not been able to contact pulmonary however read pt notations on results and pt to call to make virtual visit with them as may need CPAP.         Past Medical History:  Diagnosis Date   Seizures (HCC)    in elementary school due to blood sugar   Vertigo     Past Surgical History:  Procedure Laterality Date   TYMPANOPLASTY     VASECTOMY      Family History  Problem Relation Age of Onset   Asthma Mother    Heart attack Father 73   Stroke Father 42   Heart disease Maternal Grandmother         stent placement   Parkinson's disease Maternal Grandfather    Heart attack Paternal Grandfather 54   Asthma Son    Heart disease Paternal Uncle    Heart attack Paternal Uncle 35   Heart attack Paternal Uncle 35   Heart attack Paternal Uncle    Heart attack Paternal Uncle    Heart attack Paternal Uncle     Social History   Socioeconomic History   Marital status: Married    Spouse name: Freight forwarder   Number of children: 4   Years of education: high school   Highest education level: Not on file  Occupational History    Employer: [employer]  Tobacco Use   Smoking status: Never   Smokeless tobacco: Never  Vaping Use   Vaping status: Every Day   Substances: Nicotine  Substance and Sexual Activity   Alcohol use: Yes    Comment: once every few months   Drug use: No   Sexual activity: Yes    Birth control/protection: Surgical  Other Topics Concern   Not on file  Social History Narrative   05/21/20   From: the area   Living: with wife, Chelsey (2018)   Work: Product manager  Family: 4 kids - Jae Dire (2010), Kylie (2011) (partial custody), Harrison Mons (2013), and Maurine Minister (2018)       Enjoys: hunting, hiking, gaming w/ friends      Exercise: during hunting season - every other day is hunting - 4 miles + walking   Diet: not currently, not great - 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 20 tablet 0   ondansetron (ZOFRAN) 4 MG tablet  Take 1 tablet (4 mg total) by mouth every 8 (eight) hours as needed for nausea or vomiting. 20 tablet 0   lisinopril (ZESTRIL) 10 MG tablet Take 1 tablet (10 mg total) by mouth daily. (Patient not taking: Reported on 04/29/2023) 90 tablet 3   No facility-administered medications prior to visit.    No Known Allergies   ROS: Pertinent symptoms negative unless otherwise noted in HPI      Objective:    Physical Exam Constitutional:      General: He is awake. He is not in acute distress.    Appearance: Normal appearance. He is not ill-appearing.  HENT:     Right Ear: Tympanic membrane normal. There is impacted cerumen.     Left Ear: Tympanic membrane normal.     Nose: Nose normal.     Right Turbinates: Not enlarged or swollen.     Left Turbinates: Not enlarged or swollen.     Right Sinus: No maxillary sinus tenderness or frontal sinus tenderness.     Left Sinus: No maxillary sinus tenderness or frontal sinus tenderness.     Mouth/Throat:     Mouth: Mucous membranes are moist.     Pharynx: No pharyngeal swelling, oropharyngeal exudate or posterior oropharyngeal erythema.  Eyes:     Extraocular Movements: Extraocular movements intact.     Pupils: Pupils are equal, round, and reactive to light.  Cardiovascular:     Rate and Rhythm: Normal rate and regular rhythm.  Pulmonary:     Effort: Pulmonary effort is normal.     Breath sounds: Normal breath sounds. No wheezing.  Neurological:     Mental Status: He is alert.       BP 122/86 (BP Location: Right Arm, Patient Position: Sitting, Cuff Size: Normal)   Pulse 95   Temp 97.8 F (36.6 C) (Temporal)   Ht 5\' 7"  (1.702 m)   Wt 221 lb 3.2 oz (100.3 kg)   SpO2 96%   BMI 34.64 kg/m  Wt Readings from Last 3 Encounters:  04/29/23 221 lb 3.2 oz (100.3 kg)  03/03/23 220 lb (99.8 kg)  12/08/22 217 lb 3.2 oz (98.5 kg)     Health Maintenance Due  Topic Date Due   COVID-19 Vaccine (1 - 2023-24 season) Never done    There are  no preventive care reminders to display for this patient.  Lab Results  Component Value Date  TSH 1.61 08/25/2021   Lab Results  Component Value Date   WBC 11.4 (H) 03/03/2023   HGB 16.1 03/03/2023   HCT 45.3 03/03/2023   MCV 87.6 03/03/2023   PLT 226 03/03/2023   Lab Results  Component Value Date   NA 138 03/03/2023   K 3.5 03/03/2023   CO2 22 03/03/2023   GLUCOSE 160 (H) 03/03/2023   BUN 13 03/03/2023   CREATININE 1.02 03/03/2023   BILITOT 0.6 08/25/2021   ALKPHOS 58 08/25/2021   AST 27 08/25/2021   ALT 63 (H) 08/25/2021   PROT 7.2 08/25/2021   ALBUMIN 4.7 08/25/2021   CALCIUM 8.8 (L) 03/03/2023   ANIONGAP 12 03/03/2023   GFR 100.12 08/25/2021   Lab Results  Component Value Date   CHOL 161 08/25/2021   Lab Results  Component Value Date   HDL 37.80 (L) 08/25/2021   Lab Results  Component Value Date   LDLCALC 89 08/16/2020   Lab Results  Component Value Date   TRIG 266.0 (H) 08/25/2021   Lab Results  Component Value Date   CHOLHDL 4 08/25/2021   Lab Results  Component Value Date   HGBA1C 5.4 08/16/2020      Assessment & Plan:   Benign paroxysmal positional vertigo due to bilateral vestibular disorder Assessment & Plan: Cerumen impaction may be contributory  Irrigation in office today.  Advised zyrtec for some pet related allergies as well which may also contribute to effusion/ears  Refill zofran prn  Continue meclizine prn   Orders: -     Vitamin B12  Essential hypertension Assessment & Plan: Slight elevation at home on average Restart lisinopril Pt advised of the following:  Continue medication as prescribed. Monitor blood pressure periodically and/or when you feel symptomatic. Goal is <130/90 on average. Ensure that you have rested for 30 minutes prior to checking your blood pressure. Record your readings and bring them to your next visit if necessary.work on a low sodium diet.   Orders: -     Lisinopril; Take 1 tablet (10 mg total)  by mouth daily.  Dispense: 90 tablet; Refill: 3 -     Ambulatory referral to Cardiology  Vertigo -     Ondansetron HCl; Take 1 tablet (4 mg total) by mouth every 8 (eight) hours as needed for nausea or vomiting.  Dispense: 20 tablet; Refill: 0  Family history of MI (myocardial infarction) -     Ambulatory referral to Cardiology  Hyperglycemia -     Hemoglobin A1c -     Comprehensive metabolic panel  Mixed hyperlipidemia Assessment & Plan: Ordered lipid panel, pending results. Work on low cholesterol diet and exercise as tolerated   Orders: -     Lipid panel  Leukocytosis, unspecified type -     CBC  Obstructive sleep apnea hypopnea, mild Assessment & Plan: Pt to call pulmonary for f/u to discuss possible cpap    Nicotine use Assessment & Plan: Smoking cessation instruction/counseling given:  counseled patient on the dangers of tobacco use, advised patient to stop smoking, and reviewed strategies to maximize success Wellbutrin not an option due to h/o seizures.  Could consider chantix and or nicotine patches, nicotine gum discussed as well.     Impacted cerumen, right ear Assessment & Plan: Ceruminosis is noted.  Obtained verbal patient consent prior to procedure, possible risks of procedure discussed with pt prior, and then Wax was removed by syringing/irrigation and manual debridement was performed by me with curette. Instructions for home care  to prevent wax buildup are given and handout provided to pt .pt tolerated procedure well.       Meds ordered this encounter  Medications   lisinopril (ZESTRIL) 10 MG tablet    Sig: Take 1 tablet (10 mg total) by mouth daily.    Dispense:  90 tablet    Refill:  3    Order Specific Question:   Supervising Provider    Answer:   BEDSOLE, AMY E [[id number]]   ondansetron (ZOFRAN) 4 MG tablet    Sig: Take 1 tablet (4 mg total) by mouth every 8 (eight) hours as needed for nausea or vomiting.    Dispense:  20 tablet    Refill:  0     Order Specific Question:   Supervising Provider    Answer:   Ermalene Searing, AMY E [[id number]]    Follow-up: Return in about 1 year (around 04/28/2024) for f/u CPE.    Mort Sawyers, FNP

## 2023-04-29 NOTE — Assessment & Plan Note (Signed)
Slight elevation at home on average Restart lisinopril Pt advised of the following:  Continue medication as prescribed. Monitor blood pressure periodically and/or when you feel symptomatic. Goal is <130/90 on average. Ensure that you have rested for 30 minutes prior to checking your blood pressure. Record your readings and bring them to your next visit if necessary.work on a low sodium diet.

## 2023-04-29 NOTE — Assessment & Plan Note (Signed)
Cerumen impaction may be contributory  Irrigation in office today.  Advised zyrtec for some pet related allergies as well which may also contribute to effusion/ears  Refill zofran prn  Continue meclizine prn

## 2023-04-29 NOTE — Assessment & Plan Note (Signed)
Smoking cessation instruction/counseling given:  counseled patient on the dangers of tobacco use, advised patient to stop smoking, and reviewed strategies to maximize success Wellbutrin not an option due to h/o seizures.  Could consider chantix and or nicotine patches, nicotine gum discussed as well.

## 2023-04-29 NOTE — Assessment & Plan Note (Signed)
Ordered lipid panel, pending results. Work on low cholesterol diet and exercise as tolerated  

## 2023-04-29 NOTE — Assessment & Plan Note (Signed)
Ceruminosis is noted.  Obtained verbal patient consent prior to procedure, possible risks of procedure discussed with pt prior, and then Wax was removed by syringing/irrigation and manual debridement was performed by me with curette. Instructions for home care to prevent wax buildup are given and handout provided to pt .pt tolerated procedure well.   

## 2024-03-07 ENCOUNTER — Telehealth: Payer: Self-pay

## 2024-03-07 NOTE — Telephone Encounter (Signed)
Ok with TOC 

## 2024-03-07 NOTE — Telephone Encounter (Signed)
 Copied from CRM [id number]. Topic: Appointments - Transfer of Care >> Mar 07, 2024 12:41 PM Aisha D wrote: Pt is requesting to transfer FROM: [facility], Tabitha Dugal,NP Pt is requesting to transfer TO: [facility], Dr.McGowen Reason for requested transfer: moved and is needing a closer office  It is the responsibility of the team the patient would like to transfer to (Dr. Candise) to reach out to the patient if for any reason this transfer is not acceptable.

## 2024-04-07 ENCOUNTER — Encounter: Payer: Self-pay | Admitting: Family Medicine

## 2024-04-07 ENCOUNTER — Encounter: Admitting: Family Medicine

## 2024-04-07 NOTE — Progress Notes (Deleted)
 Office Note 04/07/2024  CC: No chief complaint on file.  HPI:  Jesus Montes is a 38 y.o.  male who is here to establish/transfer care. Patient's most recent primary MD: [facility] at [facility]. Old records in [facility] EMR were reviewed prior to or during today's visit.  Past Medical History:  Diagnosis Date   Seizures (HCC)    in elementary school due to blood sugar   Vertigo     Past Surgical History:  Procedure Laterality Date   TYMPANOPLASTY     VASECTOMY      Family History  Problem Relation Age of Onset   Asthma Mother    Heart attack Father 80   Stroke Father 60   Heart disease Maternal Grandmother        stent placement   Parkinson's disease Maternal Grandfather    Heart attack Paternal Grandfather 77   Asthma Son    Heart disease Paternal Uncle    Heart attack Paternal Uncle 35   Heart attack Paternal Uncle 35   Heart attack Paternal Uncle    Heart attack Paternal Uncle    Heart attack Paternal Uncle     Social History   Socioeconomic History   Marital status: Married    Spouse name: Freight forwarder   Number of children: 4   Years of education: high school   Highest education level: Not on file  Occupational History    Employer: [employer]  Tobacco Use   Smoking status: Never   Smokeless tobacco: Never  Vaping Use   Vaping status: Every Day   Substances: Nicotine  Substance and Sexual Activity   Alcohol use: Yes    Comment: once every few months   Drug use: No   Sexual activity: Yes    Birth control/protection: Surgical  Other Topics Concern   Not on file  Social History Narrative   05/21/20   From: the area   Living: with wife, Chelsey (2018)   Work: Product manager      Family: 4 kids - Mallie (2010), Kylie (2011) (partial custody), Feliciano (2013), and Arnetta (2018)       Enjoys: hunting, hiking, gaming w/ friends      Exercise: during hunting season - every other day is hunting - 4 miles + walking   Diet: not currently, not great  - 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   meclizine  (ANTIVERT ) 25 MG tablet Take 1 tablet (25 mg total) by mouth 3 (three) times daily as needed for dizziness.   ondansetron  (ZOFRAN ) 4 MG tablet Take 1 tablet (4 mg total) by mouth every 8 (eight) hours as needed for nausea or vomiting.   No facility-administered encounter medications on file as of 04/07/2024.    No Known Allergies  Review of Systems *** PE; There were no vitals taken for this visit. There is no height or weight  on file to calculate BMI.  Physical Exam  *** Pertinent labs:  Last CBC Lab Results  Component Value Date   WBC 7.8 04/29/2023   HGB 16.9 04/29/2023   HCT 50.5 04/29/2023   MCV 93.4 04/29/2023   MCH 31.1 03/03/2023   RDW 13.3 04/29/2023   PLT 236.0 04/29/2023   Last metabolic panel Lab Results  Component Value Date   GLUCOSE 81 04/29/2023   NA 138 04/29/2023   K 4.4 04/29/2023   CL 103 04/29/2023   CO2 27 04/29/2023   BUN 10 04/29/2023   CREATININE 0.95 04/29/2023   GFR 103.97 04/29/2023   CALCIUM 9.3 04/29/2023   PROT 7.1 04/29/2023   ALBUMIN 4.7 04/29/2023   BILITOT 0.6 04/29/2023   ALKPHOS 58 04/29/2023   AST 22 04/29/2023   ALT 38 04/29/2023   ANIONGAP 12 03/03/2023   Last lipids Lab Results  Component Value Date   CHOL 150 04/29/2023   HDL 37.00 (L) 04/29/2023   LDLCALC 88 04/29/2023   LDLDIRECT 102.0 08/25/2021   TRIG 124.0 04/29/2023   CHOLHDL 4 04/29/2023   Last  hemoglobin A1c Lab Results  Component Value Date   HGBA1C 5.4 04/29/2023   Last thyroid functions Lab Results  Component Value Date   TSH 1.61 08/25/2021   Last vitamin B12 and Folate Lab Results  Component Value Date   VITAMINB12 309 04/29/2023   ASSESSMENT AND PLAN:   New patient, establishing care. *** Health maintenance exam: Reviewed age and gender appropriate health maintenance issues (prudent diet, regular exercise, health risks of tobacco and excessive alcohol, use of seatbelts, fire alarms in home, use of sunscreen).  Also reviewed age and gender appropriate health screening as well as vaccine recommendations. Vaccines: Labs: Prostate ca screening:  Colon ca screening:   An After Visit Summary was printed and given to the patient.  No follow-ups on file.  Signed:  Gerlene Hockey, MD           04/07/2024

## 2024-05-11 ENCOUNTER — Ambulatory Visit: Payer: Self-pay | Admitting: Family Medicine

## 2024-05-11 ENCOUNTER — Encounter: Payer: Self-pay | Admitting: Family Medicine

## 2024-05-11 ENCOUNTER — Ambulatory Visit (INDEPENDENT_AMBULATORY_CARE_PROVIDER_SITE_OTHER): Admitting: Family Medicine

## 2024-05-11 VITALS — BP 131/80 | HR 66 | Temp 98.1°F | Ht 67.95 in | Wt 235.6 lb

## 2024-05-11 DIAGNOSIS — Z0000 Encounter for general adult medical examination without abnormal findings: Secondary | ICD-10-CM

## 2024-05-11 DIAGNOSIS — E538 Deficiency of other specified B group vitamins: Secondary | ICD-10-CM | POA: Diagnosis not present

## 2024-05-11 DIAGNOSIS — K760 Fatty (change of) liver, not elsewhere classified: Secondary | ICD-10-CM

## 2024-05-11 DIAGNOSIS — E782 Mixed hyperlipidemia: Secondary | ICD-10-CM | POA: Diagnosis not present

## 2024-05-11 DIAGNOSIS — N529 Male erectile dysfunction, unspecified: Secondary | ICD-10-CM

## 2024-05-11 DIAGNOSIS — I10 Essential (primary) hypertension: Secondary | ICD-10-CM | POA: Diagnosis not present

## 2024-05-11 LAB — LIPID PANEL
Cholesterol: 164 mg/dL (ref 0–200)
HDL: 39.1 mg/dL (ref >39.00)
LDL Cholesterol: 86 mg/dL (ref 0–99)
NonHDL: 124.85
Total CHOL/HDL Ratio: 4
Triglycerides: 192 mg/dL — ABNORMAL HIGH (ref 0.0–149.0)
VLDL: 38.4 mg/dL (ref 0.0–40.0)

## 2024-05-11 LAB — COMPREHENSIVE METABOLIC PANEL WITH GFR
ALT: 64 U/L — ABNORMAL HIGH (ref 0–53)
AST: 31 U/L (ref 0–37)
Albumin: 4.6 g/dL (ref 3.5–5.2)
Alkaline Phosphatase: 57 U/L (ref 39–117)
BUN: 14 mg/dL (ref 6–23)
CO2: 29 meq/L (ref 19–32)
Calcium: 9.4 mg/dL (ref 8.4–10.5)
Chloride: 103 meq/L (ref 96–112)
Creatinine, Ser: 0.95 mg/dL (ref 0.40–1.50)
GFR: 103.22 mL/min (ref >60.00)
Glucose, Bld: 95 mg/dL (ref 70–99)
Potassium: 3.9 meq/L (ref 3.5–5.1)
Sodium: 137 meq/L (ref 135–145)
Total Bilirubin: 0.6 mg/dL (ref 0.2–1.2)
Total Protein: 6.9 g/dL (ref 6.0–8.3)

## 2024-05-11 LAB — CBC WITH DIFFERENTIAL/PLATELET
Basophils Absolute: 0.1 K/uL (ref 0.0–0.1)
Basophils Relative: 0.7 % (ref 0.0–3.0)
Eosinophils Absolute: 0.2 K/uL (ref 0.0–0.7)
Eosinophils Relative: 1.8 % (ref 0.0–5.0)
HCT: 47.3 % (ref 39.0–52.0)
Hemoglobin: 15.9 g/dL (ref 13.0–17.0)
Lymphocytes Relative: 34.5 % (ref 12.0–46.0)
Lymphs Abs: 3.1 K/uL (ref 0.7–4.0)
MCHC: 33.7 g/dL (ref 30.0–36.0)
MCV: 91.7 fl (ref 78.0–100.0)
Monocytes Absolute: 0.8 K/uL (ref 0.1–1.0)
Monocytes Relative: 8.7 % (ref 3.0–12.0)
Neutro Abs: 4.9 K/uL (ref 1.4–7.7)
Neutrophils Relative %: 54.3 % (ref 43.0–77.0)
Platelets: 229 K/uL (ref 150.0–400.0)
RBC: 5.16 Mil/uL (ref 4.22–5.81)
RDW: 13.1 % (ref 11.5–15.5)
WBC: 9.1 K/uL (ref 4.0–10.5)

## 2024-05-11 LAB — VITAMIN B12: Vitamin B-12: 225 pg/mL (ref 211–911)

## 2024-05-11 LAB — HEMOGLOBIN A1C: Hgb A1c MFr Bld: 5.3 % (ref 4.6–6.5)

## 2024-05-11 LAB — TSH: TSH: 1.71 u[IU]/mL (ref 0.35–5.50)

## 2024-05-11 MED ORDER — LISINOPRIL 10 MG PO TABS: 10.0000 mg | ORAL_TABLET | Freq: Every day | ORAL | 3 refills | Status: AC

## 2024-05-11 NOTE — Progress Notes (Signed)
 Office Note 05/11/2024  CC:  Chief Complaint  Patient presents with   Establish Care    HPI:  Jesus Montes is a 38 y.o. male who is here to establish care, cpe.  F/u HTN. Patient's most recent primary MD: Ginger Patrick, FNP Old records in [facility] EMR were reviewed prior to or during today's visit.  Josian is feeling well. He has noticed some intermittent problems with difficulty having/keeping a firm erection in the last couple years.  Libido is normal. Says blood has been well-controlled on lisinopril  10 mg a day.  He takes vitamin B12 supplement.  +cig smoker.  Past Medical History:  Diagnosis Date   Hepatic steatosis    Hypertension    Mixed hyperlipidemia    Obstructive sleep apnea    Seizures (HCC)    in elementary school due to blood sugar   Vertigo     Past Surgical History:  Procedure Laterality Date   Home sleep study     June 2024--> mild obstructive sleep apnea   TYMPANOPLASTY     VASECTOMY      Family History  Problem Relation Age of Onset   Asthma Mother    Heart attack Father 21   Stroke Father 53   Heart disease Maternal Grandmother        stent placement   Parkinson's disease Maternal Grandfather    Heart attack Paternal Grandfather 27   Asthma Son    Heart disease Paternal Uncle    Heart attack Paternal Uncle 35   Heart attack Paternal Uncle 35   Heart attack Paternal Uncle    Heart attack Paternal Uncle    Heart attack Paternal Uncle     Social History   Socioeconomic History   Marital status: Married    Spouse name: Freight Forwarder   Number of children: 4   Years of education: high school   Highest education level: Not on file  Occupational History    Employer: [employer]  Tobacco Use   Smoking status: Never   Smokeless tobacco: Never  Vaping Use   Vaping status: Every Day   Substances: Nicotine  Substance and Sexual Activity   Alcohol use: Yes    Comment: once every few months   Drug use: No   Sexual activity:  Yes    Birth control/protection: Surgical  Other Topics Concern   Not on file  Social History Narrative   05/21/20   From: the area   Living: with wife, Chelsey (2018)   Work: telecommunications   Educ: HS   Family: 4 kids - Mallie (2010), Kylie (2011) (partial custody), Feliciano (2013), and Arnetta (2018)    Smokes cigs.   No alc.   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   ondansetron  (ZOFRAN ) 4 MG tablet Take 1 tablet (4 mg total) by mouth every 8 (eight) hours as needed for nausea or vomiting.   lisinopril  (ZESTRIL ) 10 MG tablet Take 1 tablet (10 mg total) by mouth daily.   [DISCONTINUED] lisinopril  (ZESTRIL ) 10 MG tablet Take 1 tablet (10 mg total) by mouth daily.   No facility-administered encounter medications on file as of 05/11/2024.    No Known Allergies  Review of Systems  Constitutional:  Negative for appetite change, chills, fatigue and fever.  HENT:  Negative for congestion, dental problem, ear pain and sore throat.   Eyes:  Negative for discharge, redness and visual disturbance.  Respiratory:  Negative for cough, chest tightness, shortness of breath and wheezing.   Cardiovascular:  Negative for chest pain, palpitations and leg swelling.  Gastrointestinal:   Negative for abdominal pain, blood in stool, diarrhea, nausea and vomiting.  Genitourinary:  Negative for difficulty urinating, dysuria, flank pain, frequency, hematuria and urgency.  Musculoskeletal:  Negative for arthralgias, back pain, joint swelling, myalgias and neck stiffness.  Skin:  Negative for pallor and rash.  Neurological:  Negative for dizziness, speech difficulty, weakness and headaches.  Hematological:  Negative for adenopathy. Does not bruise/bleed easily.  Psychiatric/Behavioral:  Negative for confusion and sleep disturbance. The patient is not nervous/anxious.     PE; Blood pressure 131/80, pulse 66, temperature 98.1 F (36.7 C), temperature source Temporal, height 5' 7.95 (1.726 m), weight 235 lb 9.6 oz (106.9 kg), SpO2 98%. Body mass index is 35.87 kg/m.  Physical Exam  Gen: Alert, well appearing.  Patient is oriented to person, place, time, and situation. AFFECT: pleasant, lucid thought and speech. ENT: Ears: EACs clear, normal epithelium.  TMs with good light reflex and landmarks bilaterally.  Eyes: no injection, icteris, swelling, or exudate.  EOMI, PERRLA. Nose: no drainage or turbinate edema/swelling.  No injection or focal lesion.  Mouth: lips without lesion/swelling.  Oral mucosa pink and moist.  Dentition intact and without obvious caries or gingival swelling.  Oropharynx without erythema, exudate, or swelling.  Neck: supple/nontender.  No LAD, mass, or TM.  Carotid pulses 2+ bilaterally, without bruits. CV: RRR, no m/r/g.   LUNGS: CTA bilat, nonlabored resps, good aeration in all lung fields. ABD: soft, NT, ND, BS normal.  No hepatospenomegaly or mass.  No bruits. EXT: no clubbing, cyanosis, or edema.  Musculoskeletal: no joint swelling, erythema, warmth, or tenderness.  ROM of all joints intact. Skin - no sores or suspicious lesions or rashes or color changes  Pertinent labs:  Last CBC Lab Results  Component Value Date   WBC 7.8 04/29/2023   HGB 16.9  04/29/2023   HCT 50.5 04/29/2023   MCV 93.4 04/29/2023   MCH 31.1 03/03/2023   RDW 13.3 04/29/2023   PLT 236.0 04/29/2023   Last metabolic panel Lab Results  Component Value Date   GLUCOSE 81 04/29/2023   NA 138 04/29/2023   K 4.4 04/29/2023   CL 103 04/29/2023   CO2 27 04/29/2023   BUN 10 04/29/2023   CREATININE 0.95 04/29/2023   GFR 103.97 04/29/2023   CALCIUM 9.3 04/29/2023   PROT 7.1 04/29/2023   ALBUMIN 4.7 04/29/2023   BILITOT 0.6 04/29/2023   ALKPHOS 58 04/29/2023   AST 22 04/29/2023   ALT 38 04/29/2023   ANIONGAP 12 03/03/2023   Last lipids Lab Results  Component Value Date  CHOL 150 04/29/2023   HDL 37.00 (L) 04/29/2023   LDLCALC 88 04/29/2023   LDLDIRECT 102.0 08/25/2021   TRIG 124.0 04/29/2023   CHOLHDL 4 04/29/2023   Last hemoglobin A1c Lab Results  Component Value Date   HGBA1C 5.4 04/29/2023   Last thyroid functions Lab Results  Component Value Date   TSH 1.61 08/25/2021   Last vitamin B12 and Folate Lab Results  Component Value Date   VITAMINB12 309 04/29/2023   ASSESSMENT AND PLAN:   New/transfer patient, establishing care.  #1 health maintenance exam: Reviewed age and gender appropriate health maintenance issues (prudent diet, regular exercise, health risks of tobacco and excessive alcohol, use of seatbelts, fire alarms in home, use of sunscreen).  Also reviewed age and gender appropriate health screening as well as vaccine recommendations. Vaccines: Tdap->declined.  Flu->declined. Labs: HP +Hba1c  #2 essential hypertension, well-controlled on lisinopril  10 mg a day. Electrolytes and creatinine today.  3.  Erectile dysfunction. Discussed that his current level of dysfunction is within normal limits for age and weight and having hypertension. Reassured him today.  #3 class II obesity, hepatic steatosis. Monitoring lipid panel, glucose, and hemoglobin A1c today.  An After Visit Summary was printed and given to the patient.  No  follow-ups on file.  Signed:  Gerlene Hockey, MD           05/11/2024

## 2024-05-12 ENCOUNTER — Other Ambulatory Visit

## 2024-05-12 DIAGNOSIS — E538 Deficiency of other specified B group vitamins: Secondary | ICD-10-CM

## 2024-05-14 LAB — INTRINSIC FACTOR BLOCKING ANTIBODY: Intrinsic Factor: NEGATIVE

## 2024-07-27 ENCOUNTER — Encounter: Payer: Self-pay | Admitting: Family Medicine

## 2024-07-27 ENCOUNTER — Ambulatory Visit: Admitting: Family Medicine

## 2024-07-27 VITALS — BP 133/87 | HR 80 | Temp 99.7°F | Ht 67.95 in | Wt 235.2 lb

## 2024-07-27 DIAGNOSIS — L989 Disorder of the skin and subcutaneous tissue, unspecified: Secondary | ICD-10-CM

## 2024-07-27 DIAGNOSIS — R21 Rash and other nonspecific skin eruption: Secondary | ICD-10-CM

## 2024-07-27 DIAGNOSIS — R351 Nocturia: Secondary | ICD-10-CM

## 2024-07-27 DIAGNOSIS — R350 Frequency of micturition: Secondary | ICD-10-CM

## 2024-07-27 LAB — POCT URINALYSIS DIPSTICK
Bilirubin, UA: NEGATIVE
Blood, UA: NEGATIVE
Glucose, UA: NEGATIVE
Ketones, UA: NEGATIVE
Leukocytes, UA: NEGATIVE
Nitrite, UA: NEGATIVE
Protein, UA: NEGATIVE
Spec Grav, UA: 1.02
Urobilinogen, UA: 0.2 U/dL
pH, UA: 6

## 2024-07-27 NOTE — Progress Notes (Signed)
 OFFICE VISIT  07/27/2024  CC:  Chief Complaint  Patient presents with   Skin Concern    Pt reports orange welts on right leg inner thigh; unsure what they are; one is raised about size of dime ,noticed this morning. Denies pain. Has other spot on his upper back he is concerned about.   Patient is a 39 y.o. male who presents for skin concern.  HPI: This morning he noticed to rusty colored spots of skin on the front of his right thigh.  No recent trauma.  They are not painful or itchy.  Has a mole on his back that his wife pointed out was there when they went to the beach about a year ago.  He has chronic feeling of increased thirst.  Urinates quite a bit during the day but he feels like this is appropriate for the level of fluid intake he has. He gets up usually 1 time per night to urinate but this does bother him a lot because once he is up he does not go back to sleep well. No dysuria or urinary hesitancy or incomplete emptying.  ROS as above, plus--> no fevers, no CP, no SOB, no wheezing, no cough, no dizziness, no HAs, no melena/hematochezia.  No myalgias or arthralgias.  No focal weakness, paresthesias, or tremors.  No acute vision or hearing abnormalities.    No recent changes in lower legs. No n/v/d or abd pain.  No palpitations.     Past Medical History:  Diagnosis Date   Hepatic steatosis    Hypertension    IBS (irritable bowel syndrome)    Mixed hyperlipidemia    Obstructive sleep apnea    Seizures (HCC)    in elementary school due to blood sugar   Vertigo    Vitamin B12 deficiency     Past Surgical History:  Procedure Laterality Date   Home sleep study     June 2024--> mild obstructive sleep apnea   TYMPANOPLASTY     VASECTOMY      Outpatient Medications Prior to Visit  Medication Sig Dispense Refill   lisinopril  (ZESTRIL ) 10 MG tablet Take 1 tablet (10 mg total) by mouth daily. 90 tablet 3   meclizine  (ANTIVERT ) 25 MG tablet Take 1 tablet (25 mg total) by  mouth 3 (three) times daily as needed for dizziness. 20 tablet 0   ondansetron  (ZOFRAN ) 4 MG tablet Take 1 tablet (4 mg total) by mouth every 8 (eight) hours as needed for nausea or vomiting. 20 tablet 0   No facility-administered medications prior to visit.    Allergies[1]  Review of Systems  As per HPI  PE:    07/27/2024    3:15 PM 05/11/2024    1:32 PM 04/29/2023    9:20 AM  Vitals with BMI  Height 5' 7.95 5' 7.953 5' 7  Weight 235 lbs 3 oz 235 lbs 10 oz 221 lbs 3 oz  BMI 35.81 35.87 34.64  Systolic 133 131 877  Diastolic 87 80 86  Pulse 80 66 95     Physical Exam  General: Alert and well-appearing. Skin: Midline thoracic spine with 3 mm oval brown nevus with punctate dark brown pigment in the center.  On the right thigh anterior aspect there are 2 small orange/rust colored oval macules approximately 1 cm each) that do not blanch.  No tenderness.  LABS:    Chemistry      Component Value Date/Time   NA 137 05/11/2024 1359   K 3.9  05/11/2024 1359   CL 103 05/11/2024 1359   CO2 29 05/11/2024 1359   BUN 14 05/11/2024 1359   CREATININE 0.95 05/11/2024 1359      Component Value Date/Time   CALCIUM 9.4 05/11/2024 1359   ALKPHOS 57 05/11/2024 1359   AST 31 05/11/2024 1359   ALT 64 (H) 05/11/2024 1359   BILITOT 0.6 05/11/2024 1359     Lab Results  Component Value Date   HGBA1C 5.3 05/11/2024   IMPRESSION AND PLAN:  #1 acute skin lesions of unknown etiology, right thigh. Monitor/observe Signs/symptoms to call or return for were reviewed and pt expressed understanding.  #2 atypical nevus on back. Refer to dermatology.  #3 chronic urinary frequency/nocturia, appropriate for his level of fluid intake. Urine today with trace leukocytes otherwise normal. Glucose and hemoglobin A1c were normal approximately 3 months ago. I recommended he limit fluids after suppertime.  An After Visit Summary was printed and given to the patient.  FOLLOW UP: Return if  symptoms worsen or fail to improve.  Signed:  Phil Asani Deniston, MD           07/27/2024       [1] No Known Allergies
